# Patient Record
Sex: Female | Born: 1990 | Hispanic: Yes | Marital: Single | Smoking: Never smoker
Health system: Southern US, Community
[De-identification: ages and names within clinical notes are randomized; demographics above are authoritative.]

## PROBLEM LIST (undated history)

## (undated) DIAGNOSIS — I10 Essential (primary) hypertension: Secondary | ICD-10-CM

## (undated) DIAGNOSIS — R809 Proteinuria, unspecified: Secondary | ICD-10-CM

## (undated) DIAGNOSIS — K5900 Constipation, unspecified: Secondary | ICD-10-CM

## (undated) DIAGNOSIS — E119 Type 2 diabetes mellitus without complications: Secondary | ICD-10-CM

---

## 2008-02-05 ENCOUNTER — Emergency Department: Payer: Self-pay | Admitting: Emergency Medicine

## 2008-03-09 ENCOUNTER — Emergency Department: Payer: Self-pay | Admitting: Emergency Medicine

## 2008-06-07 ENCOUNTER — Emergency Department: Payer: Self-pay | Admitting: Emergency Medicine

## 2011-09-30 LAB — BASIC METABOLIC PANEL
Anion Gap: 9 (ref 7–16)
BUN: 10 mg/dL (ref 7–18)
Calcium, Total: 9 mg/dL (ref 8.5–10.1)
Creatinine: 0.72 mg/dL (ref 0.60–1.30)
EGFR (Non-African Amer.): >60
Glucose: 482 mg/dL — ABNORMAL HIGH (ref 65–99)
Osmolality: 290 (ref 275–301)
Potassium: 4.2 mmol/L (ref 3.5–5.1)
Sodium: 135 mmol/L — ABNORMAL LOW (ref 136–145)

## 2011-09-30 LAB — URINALYSIS, COMPLETE
Nitrite: NEGATIVE
Squamous Epithelial: 4
WBC UR: 8 /HPF (ref 0–5)

## 2011-09-30 LAB — CBC
MCH: 30.1 pg (ref 26.0–34.0)
MCV: 90 fL (ref 80–100)
RDW: 12.6 % (ref 11.5–14.5)
WBC: 9.1 10*3/uL (ref 3.6–11.0)

## 2011-09-30 LAB — PREGNANCY, URINE: Pregnancy Test, Urine: NEGATIVE m[IU]/mL

## 2011-10-01 ENCOUNTER — Observation Stay: Payer: Self-pay | Admitting: Internal Medicine

## 2011-10-01 LAB — BASIC METABOLIC PANEL
Anion Gap: 7 (ref 7–16)
BUN: 8 mg/dL (ref 7–18)
Calcium, Total: 7.8 mg/dL — ABNORMAL LOW (ref 8.5–10.1)
Co2: 26 mmol/L (ref 21–32)
Creatinine: 0.54 mg/dL — ABNORMAL LOW (ref 0.60–1.30)
EGFR (African American): >60
Potassium: 3.7 mmol/L (ref 3.5–5.1)
Sodium: 137 mmol/L (ref 136–145)

## 2011-10-01 LAB — HEMOGLOBIN A1C: Hemoglobin A1C: 12.3 % — ABNORMAL HIGH (ref 4.2–6.3)

## 2013-04-25 ENCOUNTER — Emergency Department: Payer: Self-pay | Admitting: Emergency Medicine

## 2014-09-15 NOTE — Discharge Summary (Signed)
PATIENT NAME:  Carolyn Andrews, Carolyn Andrews MR#:  [id number] DATE OF BIRTH:  December 29, 1990  DATE OF ADMISSION:  10/01/2011 DATE OF DISCHARGE:  10/01/2011  DISCHARGE DIAGNOSES:  1. New onset diabetes. 2. Low back pain. 3. Morbid obesity.  DISCHARGE MEDICATIONS: Metformin 1 gram p.o. twice a day.  DISCHARGE DIET: ADA diet.   DISCHARGE FOLLOWUP: Follow up with the [facility] on 10/14/2011. Also the patient was given the telephone number for Dr. Carlena SaxAnna Solum, endocrinologist. The patient was advised to make an appointment.   DISCHARGE INSTRUCTIONS: The patient is advised to check sugars in the morning and also post-prandial. The patient says that her mother also has diabetes and she is very well aware of how to check sugars.  HOSPITAL COURSE: This is a 24 year old very obese female who came in because of low back pain. The patient did not have any polyuria or polydipsia. Look at the history and physical for full details. The patient was found to have a blood sugar of 482 on admission and was admitted to the hospitalist service for hyperglycemia, back pain, and possible new onset diabetes. The patient did have lower back pain with tenderness to palpation on examination when she came in. She was started on IV fluids. Hemoglobin A1c was about 12, so she was started on metformin. At discharge her low back pain resolved and did not have any further problems. Regarding obesity she was counseled to lose at least 100 pounds. She was also seen by a dietician and advised about how to check calories and carbs. She is sent home on metformin and advised to followup with Dr. Tedd SiasSolum.   TIME SPENT ON DISCHARGE PREPARATION: More than 50 minutes.  ____________________________ Katha HammingSnehalatha Kaleigh Spiegelman, MD sk:slb D: 10/02/2011 22:13:54 ET T: 10/05/2011 10:53:37 ET JOB#: [id number]  cc: Katha HammingSnehalatha Albaraa Swingle, MD, <Dictator> Katha HammingSNEHALATHA Dazani Norby MD ELECTRONICALLY SIGNED 10/06/2011 23:31

## 2014-09-15 NOTE — H&P (Signed)
PATIENT NAME:  Carolyn Andrews, Carolyn Andrews MR#:  [id number] DATE OF BIRTH:  April 14, 1991  DATE OF ADMISSION:  10/01/2011  PRIMARY CARE PHYSICIAN: The patient does not have any.  REFERRING PHYSICIAN: Dr. Bayard Males   CHIEF COMPLAINT: Hyperglycemia.   HISTORY OF PRESENT ILLNESS: This is a 24 year old female without significant past medical history who presents to the ED for lower back pain she has had for one day. The patient had basic labs done in the ED where her blood glucose was 482. The patient does not have any history of diabetes, was never diagnosed with diabetes. As well she does not have any complaints of polyuria, polydipsia, or weight loss. The patient reports her mother has a history of diabetes on insulin. As well the patient did not have any anion gap. The ED physician requested admission for further management of her hyperglycemia. As the patient was still requiring IV fluids and correction of her hyperglycemia, it was unsafe for her to be discharged with this blood glucose. Also, the patient does not have a primary care physician and she needs some adjustment of her medication prior to discharge.  Regarding her back pain, the patient reports it has been going on for one day. She denies any straining or lifting heavy objects and reports its is mainly in the lower right back area. Denies any urinary or stool incontinence, or any lower extremity weakness, tingling, or numbness.   PAST MEDICAL HISTORY: None.   PAST SURGICAL HISTORY: None.   HOME MEDICATIONS:  None.  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 Denies any history of smoking, alcohol use, or drug use.   FAMILY HISTORY: Significant for diabetes in her mother who is currently using insulin.   REVIEW OF SYSTEMS: Denies any fever, fatigue, or weakness. EYES: Denies any blurry vision, double vision, pain, or redness. ENT: Denies any tinnitus, ear pain, hearing loss, or epistaxis. RESPIRATORY: Denies  any cough, wheezing, hemoptysis, or dyspnea.  CARDIOVASCULAR: Denies any chest pain, orthopnea, edema, arrhythmia, or palpitation.  GASTROINTESTINAL: Denies any nausea, vomiting, diarrhea, or abdominal pain. GENITOURINARY: Denies any dysuria, hematuria, or renal colic. ENDO: Denies any polyuria, polydipsia, heat or cold intolerance. INTEGUMENT: Denies any acne, rash, or itching. MUSCULOSKELETAL: Complains of lower back pain. No history of arthritis or gout. NEURO: Denies any numbness, weakness, dysarthria, or epilepsy. PSYCH: Denies any anxiety, insomnia, alcohol or drug abuse.   PHYSICAL EXAMINATION: GENERAL: Young, obese female who is comfortable in bed in no apparent distress.   HEENT: Head atraumatic, normocephalic. Pupils equal, reactive to light. Pink conjunctivae. Anicteric sclerae. Moist oral mucosa.   NECK: Supple. No thyromegaly. No JVD.   CHEST: The patient has good air entry bilaterally. No wheezing, rales, or rhonchi.   CARDIOVASCULAR: S1, S2 heard. No rubs, murmurs, or gallops.   ABDOMEN: Obese, soft, nontender, nondistended. Bowel sounds present.   EXTREMITIES: No edema, no clubbing, no cyanosis.   MUSCULOSKELETAL: The patient had right lower back tender to palpation. Has negative leg raise test bilaterally. Equal sensation bilaterally. Motor strength 5/5 both extremities. Deep tendon reflexes symmetrical.   PERTINENT LABS: Glucose 482, BUN 10, creatinine 0.72, sodium 135, potassium 4.2, chloride 101, CO2 25. Anion gap 9, white blood cells 9.1, hemoglobin 15, hematocrit 45.1, platelets 242.  Pregnancy test is negative. Glucosuria in urine analysis.    ASSESSMENT AND PLAN:  1. New onset diabetes: Secondary to patient's lack of primary care physician. Will be admitted for one day for observation. Will be  started on metformin 500 mg oral twice a day before meals. As well will be started on insulin sliding scale before meals. Will be kept on fluids IV normal saline 100 mL per hour.  The patient will be given insulin and fluid bolus in the ED. We will have nutritional service and diabetic inpatient consult for the patient's education. We will check a hemoglobin A1c. 2. Obesity: The patient was counseled about the importance of weight loss, especially given the fact of her new onset diabetes.  3. Lower back pain: Appears to be due to a muscle spasm versus sciatica.  We will have the patient on Percocet and Flexeril as needed for pain.  4. Deep vein thrombosis prophylaxis: The patient is ambulatory and we will have her on sequential compression device.   TOTAL TIME SPENT ON PATIENT CARE: 40 minutes.     ____________________________ Starleen Armsawood S. Elgergawy, MD dse:bjt D: 10/01/2011 03:55:49 ET T: 10/01/2011 07:47:34 ET JOB#: [id number]  cc: Starleen Armsawood S. Elgergawy, MD, <Dictator> DAWOOD Teena IraniS ELGERGAWY MD ELECTRONICALLY SIGNED 10/03/2011 8:25

## 2014-10-29 ENCOUNTER — Encounter: Payer: Self-pay | Admitting: Emergency Medicine

## 2014-10-29 DIAGNOSIS — R109 Unspecified abdominal pain: Secondary | ICD-10-CM | POA: Insufficient documentation

## 2014-10-29 DIAGNOSIS — E1165 Type 2 diabetes mellitus with hyperglycemia: Secondary | ICD-10-CM | POA: Insufficient documentation

## 2014-10-29 DIAGNOSIS — Z3202 Encounter for pregnancy test, result negative: Secondary | ICD-10-CM | POA: Insufficient documentation

## 2014-10-29 DIAGNOSIS — R110 Nausea: Secondary | ICD-10-CM | POA: Insufficient documentation

## 2014-10-29 LAB — URINALYSIS COMPLETE WITH MICROSCOPIC (ARMC ONLY)
Bacteria, UA: NONE SEEN
Bilirubin Urine: NEGATIVE
Hgb urine dipstick: NEGATIVE
KETONES UR: NEGATIVE mg/dL
Leukocytes, UA: NEGATIVE
NITRITE: NEGATIVE
PH: 7 (ref 5.0–8.0)
Protein, ur: NEGATIVE mg/dL
SPECIFIC GRAVITY, URINE: 1.031 — AB (ref 1.005–1.030)

## 2014-10-29 LAB — POCT PREGNANCY, URINE: PREG TEST UR: NEGATIVE

## 2014-10-29 LAB — GLUCOSE, CAPILLARY: Glucose-Capillary: 451 mg/dL — ABNORMAL HIGH (ref 65–99)

## 2014-10-29 LAB — CBC
HEMATOCRIT: 45.7 % (ref 35.0–47.0)
HEMOGLOBIN: 15.3 g/dL (ref 12.0–16.0)
MCH: 29.3 pg (ref 26.0–34.0)
MCHC: 33.5 g/dL (ref 32.0–36.0)
MCV: 87.5 fL (ref 80.0–100.0)
Platelets: 219 10*3/uL (ref 150–440)
RBC: 5.22 MIL/uL — ABNORMAL HIGH (ref 3.80–5.20)
RDW: 12.6 % (ref 11.5–14.5)
WBC: 8.5 10*3/uL (ref 3.6–11.0)

## 2014-10-29 NOTE — ED Notes (Addendum)
Patient ambulatory to triage with steady gait, without difficulty or distress noted; pt reports FSBS 400 at home; c/o nausea, dizziness, and tired; pt st does not take any meds for diabetes

## 2014-10-30 ENCOUNTER — Emergency Department
Admission: EM | Admit: 2014-10-30 | Discharge: 2014-10-30 | Disposition: A | Discharge status: Home / Self Care | Payer: Self-pay | Attending: Emergency Medicine | Admitting: Emergency Medicine

## 2014-10-30 DIAGNOSIS — R739 Hyperglycemia, unspecified: Secondary | ICD-10-CM

## 2014-10-30 HISTORY — DX: Type 2 diabetes mellitus without complications: E11.9

## 2014-10-30 LAB — COMPREHENSIVE METABOLIC PANEL
ALK PHOS: 132 U/L — AB (ref 38–126)
ALT: 256 U/L — ABNORMAL HIGH (ref 14–54)
ANION GAP: 11 (ref 5–15)
AST: 120 U/L — AB (ref 15–41)
Albumin: 3.8 g/dL (ref 3.5–5.0)
BUN: 12 mg/dL (ref 6–20)
CO2: 25 mmol/L (ref 22–32)
Calcium: 9.2 mg/dL (ref 8.9–10.3)
Chloride: 99 mmol/L — ABNORMAL LOW (ref 101–111)
Creatinine, Ser: 0.7 mg/dL (ref 0.44–1.00)
Glucose, Bld: 478 mg/dL — ABNORMAL HIGH (ref 65–99)
Potassium: 3.8 mmol/L (ref 3.5–5.1)
Sodium: 135 mmol/L (ref 135–145)
TOTAL PROTEIN: 8 g/dL (ref 6.5–8.1)
Total Bilirubin: 0.5 mg/dL (ref 0.3–1.2)

## 2014-10-30 LAB — GLUCOSE, CAPILLARY
GLUCOSE-CAPILLARY: 326 mg/dL — AB (ref 65–99)
GLUCOSE-CAPILLARY: 289 mg/dL — AB (ref 65–99)

## 2014-10-30 MED ORDER — INSULIN ASPART 100 UNIT/ML ~~LOC~~ SOLN
10.0000 [IU] | Freq: Once | SUBCUTANEOUS | Status: AC
  Administered 2014-10-30: 10 [IU] via SUBCUTANEOUS

## 2014-10-30 MED ORDER — SODIUM CHLORIDE 0.9 % IV SOLN
Freq: Once | INTRAVENOUS | Status: AC
  Administered 2014-10-30: 03:00:00 via INTRAVENOUS

## 2014-10-30 MED ORDER — INSULIN ASPART 100 UNIT/ML ~~LOC~~ SOLN
SUBCUTANEOUS | Status: AC
  Filled 2014-10-30: qty 1

## 2014-10-30 MED ORDER — METFORMIN HCL 1000 MG PO TABS: 1000.0000 mg | ORAL_TABLET | Freq: Two times a day (BID) | ORAL | Status: DC

## 2014-10-30 NOTE — ED Notes (Signed)
Report received from Shana RN. Patient care assumed. Patient/RN introduction complete. Will continue to monitor.  

## 2014-10-30 NOTE — Discharge Instructions (Signed)
High Blood Sugar °High blood sugar (hyperglycemia) means that the level of sugar in your blood is higher than it should be. Signs of high blood sugar include: °· Feeling thirsty. °· Frequent peeing (urinating). °· Feeling tired or sleepy. °· Dry mouth. °· Vision changes. °· Feeling weak. °· Feeling hungry but losing weight. °· Numbness and tingling in your hands or feet. °· Headache. °When you ignore these signs, your blood sugar may keep going up. These problems may get worse, and other problems may begin. °HOME CARE °· Check your blood sugars as told by your doctor. Write down the numbers with the date and time. °· Take the right amount of insulin or diabetes pills at the right time. Write down the dose with date and time. °· Refill your insulin or diabetes pills before running out. °· Watch what you eat. Follow your meal plan. °· Drink liquids without sugar, such as water. Check with your doctor if you have kidney or heart disease. °· Follow your doctor's orders for exercise. Exercise at the same time of day. °· Keep your doctor's appointments. °GET HELP RIGHT AWAY IF:  °· You have trouble thinking or are confused. °· You have fast breathing with fruity smelling breath. °· You pass out (faint). °· You have 2 to 3 days of high blood sugars and you do not know why. °· You have chest pain. °· You are feeling sick to your stomach (nauseous) or throwing up (vomiting). °· You have sudden vision changes. °MAKE SURE YOU:  °· Understand these instructions. °· Will watch your condition. °· Will get help right away if you are not doing well or get worse. °Document Released: 03/07/2009 Document Revised: 08/02/2011 Document Reviewed: 03/07/2009 °[employer]® Patient Information ©2015 [employer]. This information is not intended to replace advice given to you by your health care provider. Make sure you discuss any questions you have with your health care provider. ° °

## 2014-10-30 NOTE — ED Notes (Signed)
Patient discharged to home per MD order. Patient in stable condition, and deemed medically cleared by ED provider for discharge. Discharge instructions reviewed with patient/family using "Teach Back"; verbalized understanding of medication education and administration, and information about follow-up care. Denies further concerns. ° °

## 2014-10-30 NOTE — ED Provider Notes (Signed)
[facility] Emergency Department Provider Note  ____________________________________________  Time seen: Approximately 106 AM  I have reviewed the triage vital signs and the nursing notes.   HISTORY  Chief Complaint Hyperglycemia    HPI Kimbree Casanas is a 24 y.o. female comes in with elevated blood sugar. The patient reports that he never her blood sugar is high she gets very thirsty and has headaches and sometimes her right arm will get numb. The patient reports that she's had these symptoms for 3 days. The patient was diagnosed with diabetes a few months ago but reports that she does not take any medication for her diabetes. She reports that she has not been able to follow-up with her primary care physician and no one ever gave her medication for her diabetes. The patient reports that when she tries to follow-up with [facility] they are unable to give her an appointment for months. The patient reports that she has been drinking water as well. She reports that she did have some abdominal pain but it improves whenever she drinks water.The patient is no pain currently.   Past Medical History  Diagnosis Date  . Diabetes     There are no active problems to display for this patient.   History reviewed. No pertinent past surgical history.  No current outpatient prescriptions on file.  Allergies Review of patient's allergies indicates no known allergies.  No family history on file.  Social History History  Substance Use Topics  . Smoking status: Never Smoker   . Smokeless tobacco: Not on file  . Alcohol Use: No    Review of Systems Constitutional: No fever/chills Eyes: No visual changes. ENT: No sore throat. Cardiovascular: Denies chest pain. Respiratory: Denies shortness of breath. Gastrointestinal: Nausea, abdominal pain Genitourinary: Negative for dysuria. Musculoskeletal: Negative for back pain. Skin: Negative for  rash. Neurological: headache  10-point ROS otherwise negative.  ____________________________________________   PHYSICAL EXAM:  VITAL SIGNS: ED Triage Vitals  Enc Vitals Group     BP 10/29/14 2250 173/96 mmHg     Pulse Rate 10/29/14 2250 79     Resp 10/29/14 2250 18     Temp 10/29/14 2250 97.6 F (36.4 C)     Temp Source 10/29/14 2250 Oral     SpO2 10/29/14 2250 98 %     Weight 10/29/14 2250 190 lb (86.183 kg)     Height 10/29/14 2250 5' (1.524 m)     Head Cir --      Peak Flow --      Pain Score 10/30/14 0258 3     Pain Loc --      Pain Edu? --      Excl. in GC? --     Constitutional: Alert and oriented. Well appearing and in no acute distress. Eyes: Conjunctivae are normal. PERRL. EOMI. Head: Atraumatic. Nose: No congestion/rhinnorhea. Mouth/Throat: Mucous membranes are moist.  Oropharynx non-erythematous. Cardiovascular: Normal rate, regular rhythm. Grossly normal heart sounds.  Good peripheral circulation. Respiratory: Normal respiratory effort.  No retractions. Lungs CTAB. Gastrointestinal: Soft and nontender. No distention. Positive bowel sounds Genitourinary: Deferred Musculoskeletal: No lower extremity tenderness nor edema.   Neurologic:  Normal speech and language. No gross focal neurologic deficits are appreciated. Skin:  Skin is warm, dry and intact. No rash noted. Psychiatric: Mood and affect are normal.   ____________________________________________   LABS (all labs ordered are listed, but only abnormal results are displayed)  Labs Reviewed  CBC - Abnormal; Notable for the  following:    RBC 5.22 (*)    All other components within normal limits  COMPREHENSIVE METABOLIC PANEL - Abnormal; Notable for the following:    Chloride 99 (*)    Glucose, Bld 478 (*)    AST 120 (*)    ALT 256 (*)    Alkaline Phosphatase 132 (*)    All other components within normal limits  URINALYSIS COMPLETEWITH MICROSCOPIC ([facility] ONLY) - Abnormal; Notable for the  following:    Color, Urine STRAW (*)    APPearance CLEAR (*)    Glucose, UA >500 (*)    Specific Gravity, Urine 1.031 (*)    Squamous Epithelial / LPF 0-5 (*)    All other components within normal limits  GLUCOSE, CAPILLARY - Abnormal; Notable for the following:    Glucose-Capillary 451 (*)    All other components within normal limits  GLUCOSE, CAPILLARY - Abnormal; Notable for the following:    Glucose-Capillary 326 (*)    All other components within normal limits  CBG MONITORING, ED  POC URINE PREG, ED  POCT PREGNANCY, URINE   ____________________________________________  EKG  None ____________________________________________  RADIOLOGY  None ____________________________________________   PROCEDURES  Procedure(s) performed: None  Critical Care performed: No  ____________________________________________   INITIAL IMPRESSION / ASSESSMENT AND PLAN / ED COURSE  Pertinent labs & imaging results that were available during my care of the patient were reviewed by me and considered in my medical decision making (see chart for details).  The patient is a 24 year old female with a history of diabetes who has not been taking any medication and comes in with hyperglycemia. We did give the patient 2 L of normal saline and it did bring her blood sugar down to 326. We also did patient 10 units of insulin subcutaneously and her blood sugar has been brought down to 289. I will give the patient a prescription for metformin and encourage her that she does need to follow-up and establish care with a primary care physician. The patient otherwise has no other complaints and feels well while the emergency department. She'll be discharged home. He does not have any significant anion gap, hyponatremia other electrolyte abnormalities with a concern for severe hyper glycemia. ____________________________________________   FINAL CLINICAL IMPRESSION(S) / ED DIAGNOSES  Final diagnoses:   Hyperglycemia       Rebecka ApleyAllison P Dhrithi Riche, MD 10/30/14 [id number]

## 2014-12-28 ENCOUNTER — Emergency Department
Admission: EM | Admit: 2014-12-28 | Discharge: 2014-12-28 | Disposition: A | Discharge status: Home / Self Care | Payer: Self-pay | Attending: Emergency Medicine | Admitting: Emergency Medicine

## 2014-12-28 ENCOUNTER — Encounter: Payer: Self-pay | Admitting: Emergency Medicine

## 2014-12-28 DIAGNOSIS — R739 Hyperglycemia, unspecified: Secondary | ICD-10-CM

## 2014-12-28 DIAGNOSIS — E860 Dehydration: Secondary | ICD-10-CM

## 2014-12-28 DIAGNOSIS — Z3202 Encounter for pregnancy test, result negative: Secondary | ICD-10-CM | POA: Insufficient documentation

## 2014-12-28 DIAGNOSIS — E1165 Type 2 diabetes mellitus with hyperglycemia: Secondary | ICD-10-CM | POA: Insufficient documentation

## 2014-12-28 DIAGNOSIS — R0600 Dyspnea, unspecified: Secondary | ICD-10-CM

## 2014-12-28 DIAGNOSIS — Z79899 Other long term (current) drug therapy: Secondary | ICD-10-CM | POA: Insufficient documentation

## 2014-12-28 LAB — CBC
HEMATOCRIT: 44.3 % (ref 35.0–47.0)
Hemoglobin: 15 g/dL (ref 12.0–16.0)
MCH: 29.6 pg (ref 26.0–34.0)
MCHC: 33.9 g/dL (ref 32.0–36.0)
MCV: 87.3 fL (ref 80.0–100.0)
PLATELETS: 238 10*3/uL (ref 150–440)
RBC: 5.08 MIL/uL (ref 3.80–5.20)
RDW: 12.5 % (ref 11.5–14.5)
WBC: 8.5 10*3/uL (ref 3.6–11.0)

## 2014-12-28 LAB — URINALYSIS COMPLETE WITH MICROSCOPIC (ARMC ONLY)
BACTERIA UA: NONE SEEN
Bilirubin Urine: NEGATIVE
Hgb urine dipstick: NEGATIVE
LEUKOCYTES UA: NEGATIVE
Nitrite: NEGATIVE
Protein, ur: NEGATIVE mg/dL
Specific Gravity, Urine: 1.027 (ref 1.005–1.030)
pH: 9 — ABNORMAL HIGH (ref 5.0–8.0)

## 2014-12-28 LAB — BASIC METABOLIC PANEL
Anion gap: 9 (ref 5–15)
BUN: 11 mg/dL (ref 6–20)
CALCIUM: 9.5 mg/dL (ref 8.9–10.3)
CHLORIDE: 101 mmol/L (ref 101–111)
CO2: 25 mmol/L (ref 22–32)
Creatinine, Ser: 0.58 mg/dL (ref 0.44–1.00)
Glucose, Bld: 456 mg/dL — ABNORMAL HIGH (ref 65–99)
POTASSIUM: 3.9 mmol/L (ref 3.5–5.1)
SODIUM: 135 mmol/L (ref 135–145)

## 2014-12-28 LAB — GLUCOSE, CAPILLARY
Glucose-Capillary: 459 mg/dL — ABNORMAL HIGH (ref 65–99)
Glucose-Capillary: 344 mg/dL — ABNORMAL HIGH (ref 65–99)

## 2014-12-28 LAB — POCT PREGNANCY, URINE: Preg Test, Ur: NEGATIVE

## 2014-12-28 MED ORDER — SODIUM CHLORIDE 0.9 % IV BOLUS (SEPSIS)
1000.0000 mL | Freq: Once | INTRAVENOUS | Status: AC
  Administered 2014-12-28: 1000 mL via INTRAVENOUS

## 2014-12-28 MED ORDER — ONDANSETRON 8 MG PO TBDP: 8.0000 mg | ORAL_TABLET | Freq: Three times a day (TID) | ORAL | Status: DC | PRN

## 2014-12-28 NOTE — Discharge Instructions (Signed)
Blood Glucose Monitoring °Monitoring your blood glucose (also know as blood sugar) helps you to manage your diabetes. It also helps you and your health care provider monitor your diabetes and determine how well your treatment plan is working. °WHY SHOULD YOU MONITOR YOUR BLOOD GLUCOSE? °· It can help you understand how food, exercise, and medicine affect your blood glucose. °· It allows you to know what your blood glucose is at any given moment. You can quickly tell if you are having low blood glucose (hypoglycemia) or high blood glucose (hyperglycemia). °· It can help you and your health care provider know how to adjust your medicines. °· It can help you understand how to manage an illness or adjust medicine for exercise. °WHEN SHOULD YOU TEST? °Your health care provider will help you decide how often you should check your blood glucose. This may depend on the type of diabetes you have, your diabetes control, or the types of medicines you are taking. Be sure to write down all of your blood glucose readings so that this information can be reviewed with your health care provider. See below for examples of testing times that your health care provider may suggest. °Type 1 Diabetes °· Test 4 times a day if you are in good control, using an insulin pump, or perform multiple daily injections. °· If your diabetes is not well controlled or if you are sick, you may need to monitor more often. °· It is a good idea to also monitor: °· Before and after exercise. °· Between meals and 2 hours after a meal. °· Occasionally between 2:00 a.m. and 3:00 a.m. °Type 2 Diabetes °· It can vary with each person, but generally, if you are on insulin, test 4 times a day. °· If you take medicines by mouth (orally), test 2 times a day. °· If you are on a controlled diet, test once a day. °· If your diabetes is not well controlled or if you are sick, you may need to monitor more often. °HOW TO MONITOR YOUR BLOOD GLUCOSE °Supplies  Needed °· Blood glucose meter. °· Test strips for your meter. Each meter has its own strips. You must use the strips that go with your own meter. °· A pricking needle (lancet). °· A device that holds the lancet (lancing device). °· A journal or log book to write down your results. °Procedure °· Wash your hands with soap and water. Alcohol is not preferred. °· Prick the side of your finger (not the tip) with the lancet. °· Gently milk the finger until a small drop of blood appears. °· Follow the instructions that come with your meter for inserting the test strip, applying blood to the strip, and using your blood glucose meter. °Other Areas to Get Blood for Testing °Some meters allow you to use other areas of your body (other than your finger) to test your blood. These areas are called alternative sites. The most common alternative sites are: °· The forearm. °· The thigh. °· The back area of the lower leg. °· The palm of the hand. °The blood flow in these areas is slower. Therefore, the blood glucose values you get may be delayed, and the numbers are different from what you would get from your fingers. Do not use alternative sites if you think you are having hypoglycemia. Your reading will not be accurate. Always use a finger if you are having hypoglycemia. Also, if you cannot feel your lows (hypoglycemia unawareness), always use your fingers for your   blood glucose checks. ADDITIONAL TIPS FOR GLUCOSE MONITORING  Do not reuse lancets.  Always carry your supplies with you.  All blood glucose meters have a 24-hour "hotline" number to call if you have questions or need help.  Adjust (calibrate) your blood glucose meter with a control solution after finishing a few boxes of strips. BLOOD GLUCOSE RECORD KEEPING It is a good idea to keep a daily record or log of your blood glucose readings. Most glucose meters, if not all, keep your glucose records stored in the meter. Some meters come with the ability to download  your records to your home computer. Keeping a record of your blood glucose readings is especially helpful if you are wanting to look for patterns. Make notes to go along with the blood glucose readings because you might forget what happened at that exact time. Keeping good records helps you and your health care provider to work together to achieve good diabetes management.  Document Released: 05/13/2003 Document Revised: 09/24/2013 Document Reviewed: 10/02/2012 [facility] Patient Information 2015 [place], [place]. This information is not intended to replace advice given to you by your health care provider. Make sure you discuss any questions you have with your health care provider.  Dehydration, Adult Dehydration is when you lose more fluids from the body than you take in. Vital organs like the kidneys, brain, and heart cannot function without a proper amount of fluids and salt. Any loss of fluids from the body can cause dehydration.  CAUSES   Vomiting.  Diarrhea.  Excessive sweating.  Excessive urine output.  Fever. SYMPTOMS  Mild dehydration  Thirst.  Dry lips.  Slightly dry mouth. Moderate dehydration  Very dry mouth.  Sunken eyes.  Skin does not bounce back quickly when lightly pinched and released.  Dark urine and decreased urine production.  Decreased tear production.  Headache. Severe dehydration  Very dry mouth.  Extreme thirst.  Rapid, weak pulse (more than 100 beats per minute at rest).  Cold hands and feet.  Not able to sweat in spite of heat and temperature.  Rapid breathing.  Blue lips.  Confusion and lethargy.  Difficulty being awakened.  Minimal urine production.  No tears. DIAGNOSIS  Your caregiver will diagnose dehydration based on your symptoms and your exam. Blood and urine tests will help confirm the diagnosis. The diagnostic evaluation should also identify the cause of dehydration. TREATMENT  Treatment of mild or moderate dehydration  can often be done at home by increasing the amount of fluids that you drink. It is best to drink small amounts of fluid more often. Drinking too much at one time can make vomiting worse. Refer to the home care instructions below. Severe dehydration needs to be treated at the hospital where you will probably be given intravenous (IV) fluids that contain water and electrolytes. HOME CARE INSTRUCTIONS   Ask your caregiver about specific rehydration instructions.  Drink enough fluids to keep your urine clear or pale yellow.  Drink small amounts frequently if you have nausea and vomiting.  Eat as you normally do.  Avoid:  Foods or drinks high in sugar.  Carbonated drinks.  Juice.  Extremely hot or cold fluids.  Drinks with caffeine.  Fatty, greasy foods.  Alcohol.  Tobacco.  Overeating.  Gelatin desserts.  Wash your hands well to avoid spreading bacteria and viruses.  Only take over-the-counter or prescription medicines for pain, discomfort, or fever as directed by your caregiver.  Ask your caregiver if you should continue all prescribed and  over-the-counter medicines.  Keep all follow-up appointments with your caregiver. SEEK MEDICAL CARE IF:  You have abdominal pain and it increases or stays in one area (localizes).  You have a rash, stiff neck, or severe headache.  You are irritable, sleepy, or difficult to awaken.  You are weak, dizzy, or extremely thirsty. SEEK IMMEDIATE MEDICAL CARE IF:   You are unable to keep fluids down or you get worse despite treatment.  You have frequent episodes of vomiting or diarrhea.  You have blood or green matter (bile) in your vomit.  You have blood in your stool or your stool looks black and tarry.  You have not urinated in 6 to 8 hours, or you have only urinated a small amount of very dark urine.  You have a fever.  You faint. MAKE SURE YOU:   Understand these instructions.  Will watch your condition.  Will get  help right away if you are not doing well or get worse. Document Released: 05/10/2005 Document Revised: 08/02/2011 Document Reviewed: 12/28/2010 [facility] Patient Information 2015 [place], [place]. This information is not intended to replace advice given to you by your health care provider. Make sure you discuss any questions you have with your health care provider.  Hyperglycemia Hyperglycemia occurs when the glucose (sugar) in your blood is too high. Hyperglycemia can happen for many reasons, but it most often happens to people who do not know they have diabetes or are not managing their diabetes properly.  CAUSES  Whether you have diabetes or not, there are other causes of hyperglycemia. Hyperglycemia can occur when you have diabetes, but it can also occur in other situations that you might not be as aware of, such as: Diabetes  If you have diabetes and are having problems controlling your blood glucose, hyperglycemia could occur because of some of the following reasons:  Not following your meal plan.  Not taking your diabetes medications or not taking it properly.  Exercising less or doing less activity than you normally do.  Being sick. Pre-diabetes  This cannot be ignored. Before people develop Type 2 diabetes, they almost always have "pre-diabetes." This is when your blood glucose levels are higher than normal, but not yet high enough to be diagnosed as diabetes. Research has shown that some long-term damage to the body, especially the heart and circulatory system, may already be occurring during pre-diabetes. If you take action to manage your blood glucose when you have pre-diabetes, you may delay or prevent Type 2 diabetes from developing. Stress  If you have diabetes, you may be "diet" controlled or on oral medications or insulin to control your diabetes. However, you may find that your blood glucose is higher than usual in the hospital whether you have diabetes or not. This is often  referred to as "stress hyperglycemia." Stress can elevate your blood glucose. This happens because of hormones put out by the body during times of stress. If stress has been the cause of your high blood glucose, it can be followed regularly by your caregiver. That way he/she can make sure your hyperglycemia does not continue to get worse or progress to diabetes. Steroids  Steroids are medications that act on the infection fighting system (immune system) to block inflammation or infection. One side effect can be a rise in blood glucose. Most people can produce enough extra insulin to allow for this rise, but for those who cannot, steroids make blood glucose levels go even higher. It is not unusual for steroid treatments  to "uncover" diabetes that is developing. It is not always possible to determine if the hyperglycemia will go away after the steroids are stopped. A special blood test called an A1c is sometimes done to determine if your blood glucose was elevated before the steroids were started. SYMPTOMS  Thirsty.  Frequent urination.  Dry mouth.  Blurred vision.  Tired or fatigue.  Weakness.  Sleepy.  Tingling in feet or leg. DIAGNOSIS  Diagnosis is made by monitoring blood glucose in one or all of the following ways:  A1c test. This is a chemical found in your blood.  Fingerstick blood glucose monitoring.  Laboratory results. TREATMENT  First, knowing the cause of the hyperglycemia is important before the hyperglycemia can be treated. Treatment may include, but is not be limited to:  Education.  Change or adjustment in medications.  Change or adjustment in meal plan.  Treatment for an illness, infection, etc.  More frequent blood glucose monitoring.  Change in exercise plan.  Decreasing or stopping steroids.  Lifestyle changes. HOME CARE INSTRUCTIONS   Test your blood glucose as directed.  Exercise regularly. Your caregiver will give you instructions about  exercise. Pre-diabetes or diabetes which comes on with stress is helped by exercising.  Eat wholesome, balanced meals. Eat often and at regular, fixed times. Your caregiver or nutritionist will give you a meal plan to guide your sugar intake.  Being at an ideal weight is important. If needed, losing as little as 10 to 15 pounds may help improve blood glucose levels. SEEK MEDICAL CARE IF:   You have questions about medicine, activity, or diet.  You continue to have symptoms (problems such as increased thirst, urination, or weight gain). SEEK IMMEDIATE MEDICAL CARE IF:   You are vomiting or have diarrhea.  Your breath smells fruity.  You are breathing faster or slower.  You are very sleepy or incoherent.  You have numbness, tingling, or pain in your feet or hands.  You have chest pain.  Your symptoms get worse even though you have been following your caregiver's orders.  If you have any other questions or concerns. Document Released: 11/03/2000 Document Revised: 08/02/2011 Document Reviewed: 09/06/2011 [facility] Patient Information 2015 [place], [place]. This information is not intended to replace advice given to you by your health care provider. Make sure you discuss any questions you have with your health care provider.

## 2014-12-28 NOTE — ED Notes (Signed)
Pt reports SOB started today.  Pt denies CP and nausea.  Pt reports some dizziness.  Pt hyperglycemic in triage.  Reports taking glipizide, started about a month ago.  Pt reports right side pain, reports it feels tired from breathing hard.  Respirations equal and unlabored at this time.  Pt NAD at this time.

## 2014-12-28 NOTE — ED Notes (Signed)
Pt. Has bs of 459 in triage.

## 2014-12-28 NOTE — ED Provider Notes (Signed)
[facility] Emergency Department Provider Note  ____________________________________________  Time seen: 10:00 PM  I have reviewed the triage vital signs and the nursing notes.   HISTORY  Chief Complaint Shortness of Breath    HPI Carolyn Andrews is a 24 y.o. female who complains of shortness of breath that started 5 minutes after taking her glipizide this evening. She notes that she was recently diagnosed with diabetes about one month ago. She was unable to tolerate metformin due to GI upset so she only takes glipizide. She follows up with [facility] endocrinology. She does note that just prior to the onset of symptoms she was feeling worried about her diabetes.  She does not routinely check her blood sugars and can't remember the last time she checked it. She does report having polyuria, but no dysuria or hematuria. No fever chills chest pain nausea vomiting diarrhea abdominal pain or cough. No syncope.     Past Medical History  Diagnosis Date  . Diabetes     There are no active problems to display for this patient.   History reviewed. No pertinent past surgical history.  Current Outpatient Rx  Name  Route  Sig  Dispense  Refill  . FLUoxetine (PROZAC) 20 MG capsule   Oral   Take 20 mg by mouth daily.         Marland Kitchen glipiZIDE (GLUCOTROL) 10 MG tablet   Oral   Take 10 mg by mouth daily before breakfast.         . metFORMIN (GLUCOPHAGE) 1000 MG tablet   Oral   Take 1 tablet (1,000 mg total) by mouth 2 (two) times daily with a meal.   60 tablet   0   . ondansetron (ZOFRAN ODT) 8 MG disintegrating tablet   Oral   Take 1 tablet (8 mg total) by mouth every 8 (eight) hours as needed for nausea or vomiting.   20 tablet   0     Allergies Review of patient's allergies indicates no known allergies.  Family History  Problem Relation Age of Onset  . Diabetes Mother     Social History History  Substance Use Topics  . Smoking status: Never  Smoker   . Smokeless tobacco: Not on file  . Alcohol Use: No    Review of Systems  Constitutional: No fever or chills. No weight changes Eyes:No blurry vision or double vision.  ENT: No sore throat. Cardiovascular: No chest pain. Respiratory: Dyspnea as above. Gastrointestinal: Negative for abdominal pain, vomiting and diarrhea.  No BRBPR or melena. Genitourinary: Negative for dysuria, urinary retention, bloody urine, or difficulty urinating. Musculoskeletal: Negative for back pain. No joint swelling or pain. Skin: Negative for rash. Neurological: Negative for headaches, focal weakness or numbness. Psychiatric:Depression.   Endocrine:No hot/cold intolerance, changes in energy, or sleep difficulty.  10-point ROS otherwise negative.  ____________________________________________   PHYSICAL EXAM:  VITAL SIGNS: ED Triage Vitals  Enc Vitals Group     BP 12/28/14 2127 139/70 mmHg     Pulse Rate 12/28/14 2127 69     Resp 12/28/14 2204 18     Temp --      Temp src --      SpO2 12/28/14 2127 99 %     Weight 12/28/14 2127 197 lb (89.359 kg)     Height 12/28/14 2127  (1.549 m)     Head Cir --      Peak Flow --      Pain Score 12/28/14 2133  5     Pain Loc --      Pain Edu? --      Excl. in GC? --      Constitutional: Alert and oriented. Well appearing and in no distress. Eyes: No scleral icterus. No conjunctival pallor. PERRL. EOMI ENT   Head: Normocephalic and atraumatic.   Nose: No congestion/rhinnorhea. No septal hematoma   Mouth/Throat: MMM, no pharyngeal erythema. No peritonsillar mass. No uvula shift.   Neck: No stridor. No SubQ emphysema. No meningismus. Hematological/Lymphatic/Immunilogical: No cervical lymphadenopathy. Cardiovascular: RRR. Normal and symmetric distal pulses are present in all extremities. No murmurs, rubs, or gallops. Respiratory: Normal respiratory effort without tachypnea nor retractions. Breath sounds are clear and equal  bilaterally. No wheezes/rales/rhonchi. Gastrointestinal: Soft and nontender. No distention. There is no CVA tenderness.  No rebound, rigidity, or guarding. Genitourinary: deferred Musculoskeletal: Nontender with normal range of motion in all extremities. No joint effusions.  No lower extremity tenderness.  No edema. Neurologic:   Normal speech and language.  CN 2-10 normal. Motor grossly intact. No pronator drift.  Normal gait. No gross focal neurologic deficits are appreciated.  Skin:  Skin is warm, dry and intact. No rash noted.  No petechiae, purpura, or bullae. Psychiatric: Mood and affect are normal. Speech and behavior are normal. Patient exhibits appropriate insight and judgment.  ____________________________________________    LABS (pertinent positives/negatives) (all labs ordered are listed, but only abnormal results are displayed) Labs Reviewed  BASIC METABOLIC PANEL - Abnormal; Notable for the following:    Glucose, Bld 456 (*)    All other components within normal limits  URINALYSIS COMPLETEWITH MICROSCOPIC ([facility] ONLY) - Abnormal; Notable for the following:    Color, Urine COLORLESS (*)    APPearance CLEAR (*)    Glucose, UA >500 (*)    Ketones, ur TRACE (*)    pH 9.0 (*)    Squamous Epithelial / LPF 0-5 (*)    All other components within normal limits  GLUCOSE, CAPILLARY - Abnormal; Notable for the following:    Glucose-Capillary 459 (*)    All other components within normal limits  GLUCOSE, CAPILLARY - Abnormal; Notable for the following:    Glucose-Capillary 344 (*)    All other components within normal limits  CBC  CBG MONITORING, ED  CBG MONITORING, ED  POCT PREGNANCY, URINE  CBG MONITORING, ED   ____________________________________________   EKG    ____________________________________________    RADIOLOGY    ____________________________________________   PROCEDURES  ____________________________________________   INITIAL IMPRESSION /  ASSESSMENT AND PLAN / ED COURSE  Pertinent labs & imaging results that were available during my care of the patient were reviewed by me and considered in my medical decision making (see chart for details).  Labs unremarkable no evidence of acidosis. She is tolerating oral intake and not in distress. Advised the patient this is likely due to dehydration from polyuria and hyperglycemia as well as an element of stress or anxiety. Low suspicion that this fast after taking the glipizide has anything to do with the medication. Advised her to continue taking the medication and to follow-up at [facility] endocrinology closely for continued titration and adjustment of her hyperglycemia regimen.  ____________________________________________   FINAL CLINICAL IMPRESSION(S) / ED DIAGNOSES  Final diagnoses:  Dyspnea  Dehydration  Hyperglycemia      Sharman Cheek, MD 12/28/14 2321

## 2014-12-28 NOTE — ED Notes (Signed)
Pt. States recent dx of diabetes 2 last month, pt. States taking glipizide.  Pt. States she took glipizide tonight, pt. States soon afterwards became short of breath.  Pt. Denies vomiting or diarrhea.  Pt. States feeling dizzy tonight.

## 2015-02-23 DIAGNOSIS — E119 Type 2 diabetes mellitus without complications: Secondary | ICD-10-CM | POA: Insufficient documentation

## 2015-02-23 DIAGNOSIS — Z79899 Other long term (current) drug therapy: Secondary | ICD-10-CM | POA: Insufficient documentation

## 2015-02-23 DIAGNOSIS — N760 Acute vaginitis: Secondary | ICD-10-CM | POA: Insufficient documentation

## 2015-02-23 NOTE — ED Notes (Signed)
Patient ambulatory to triage with steady gait, without difficulty or distress noted; pt reports vag itching since taking pill to regulate menstrual period; used vagisil and monistat without relief

## 2015-02-24 ENCOUNTER — Encounter: Payer: Self-pay | Admitting: Emergency Medicine

## 2015-02-24 ENCOUNTER — Emergency Department
Admission: EM | Admit: 2015-02-24 | Discharge: 2015-02-24 | Disposition: A | Discharge status: Home / Self Care | Payer: Self-pay | Attending: Emergency Medicine | Admitting: Emergency Medicine

## 2015-02-24 DIAGNOSIS — N760 Acute vaginitis: Secondary | ICD-10-CM

## 2015-02-24 NOTE — Discharge Instructions (Signed)

## 2015-02-24 NOTE — ED Provider Notes (Signed)
[facility] Emergency Department Provider Note  ____________________________________________  Time seen: Approximately 3:42 AM  I have reviewed the triage vital signs and the nursing notes.   HISTORY  Chief Complaint Vaginitis    HPI Carolyn Andrews is a 24 y.o. female with a history of diabetes who comes in with vaginal itching. The patient reports that she was given a pill to regulate her menstrual cycle that she was postictal for 10 days. The patient reports that she took the first 2 days of pills and then started having some vaginal itching. She reports initially she was using Paxil and then took some Monistat today. She reports that currently the itching and stinging is improved. She reports that her blood sugars have been good in control. She reports that this is been going on for the past week. She initially received the pills from [facility]. The patient denies any vomiting or nausea, no abdominal pain no chest pain or shortness of breath.   Past Medical History  Diagnosis Date  . Diabetes (HCC)     There are no active problems to display for this patient.   History reviewed. No pertinent past surgical history.  Current Outpatient Rx  Name  Route  Sig  Dispense  Refill  . FLUoxetine (PROZAC) 20 MG capsule   Oral   Take 20 mg by mouth daily.         Marland Kitchen glipiZIDE (GLUCOTROL) 10 MG tablet   Oral   Take 10 mg by mouth daily before breakfast.         . metFORMIN (GLUCOPHAGE) 1000 MG tablet   Oral   Take 1 tablet (1,000 mg total) by mouth 2 (two) times daily with a meal.   60 tablet   0   . ondansetron (ZOFRAN ODT) 8 MG disintegrating tablet   Oral   Take 1 tablet (8 mg total) by mouth every 8 (eight) hours as needed for nausea or vomiting.   20 tablet   0     Allergies Review of patient's allergies indicates no known allergies.  Family History  Problem Relation Age of Onset  . Diabetes Mother     Social History Social  History  Substance Use Topics  . Smoking status: Never Smoker   . Smokeless tobacco: None  . Alcohol Use: No    Review of Systems Constitutional: No fever/chills Eyes: No visual changes. ENT: No sore throat. Cardiovascular: Denies chest pain. Respiratory: Denies shortness of breath. Gastrointestinal: No abdominal pain.  No nausea, no vomiting.  No diarrhea.  No constipation. Genitourinary: Vaginal itching Musculoskeletal: Negative for back pain. Skin: Negative for rash. Neurological: Negative for headaches, focal weakness or numbness.  10-point ROS otherwise negative.  ____________________________________________   PHYSICAL EXAM:  VITAL SIGNS: ED Triage Vitals  Enc Vitals Group     BP 02/24/15 0000 143/93 mmHg     Pulse Rate 02/24/15 0000 74     Resp 02/24/15 0000 20     Temp 02/24/15 0000 98.4 F (36.9 C)     Temp Source 02/24/15 0000 Oral     SpO2 02/24/15 0000 100 %     Weight 02/24/15 0000 195 lb (88.451 kg)     Height 02/24/15 0000 5' (1.524 m)     Head Cir --      Peak Flow --      Pain Score --      Pain Loc --      Pain Edu? --  Excl. in GC? --     Constitutional: Alert and oriented. Well appearing and in no acute distress. Eyes: Conjunctivae are normal. PERRL. EOMI. Head: Atraumatic. Nose: No congestion/rhinnorhea. Mouth/Throat: Mucous membranes are moist.  Oropharynx non-erythematous. Cardiovascular: Normal rate, regular rhythm. Grossly normal heart sounds.  Good peripheral circulation. Respiratory: Normal respiratory effort.  No retractions. Lungs CTAB. Gastrointestinal: Soft and nontender. No distention. Positive bowel sounds Genitourinary: Patient refused Musculoskeletal: No lower extremity tenderness nor edema.   Neurologic:  Normal speech and language.  Skin:  Skin is warm, dry and intact.  Psychiatric: Mood and affect are normal.   ____________________________________________   LABS (all labs ordered are listed, but only abnormal  results are displayed)  Labs Reviewed  WET PREP, GENITAL  CHLAMYDIA/NGC RT PCR ([facility] ONLY)  URINALYSIS COMPLETEWITH MICROSCOPIC ([facility] ONLY)  CBG MONITORING, ED   ____________________________________________  EKG  None ____________________________________________  RADIOLOGY  None ____________________________________________   PROCEDURES  Procedure(s) performed: None  Critical Care performed: No  ____________________________________________   INITIAL IMPRESSION / ASSESSMENT AND PLAN / ED COURSE  Pertinent labs & imaging results that were available during my care of the patient were reviewed by me and considered in my medical decision making (see chart for details).  This is a 24 year old female who comes in with vaginal itching. When I walked into the room the patient reports that her pain and itching is improved. I asked her if she wanted me to fully examine her and she reports no. The patient reports that her ride has to go to work and that she has a OB/GYN appointment on Wednesday. I will discharge the patient to home. I informed her that she should continue to have her vaginal itching evaluated. Otherwise the patient had no further complaints or concerns. She decided that she wanted to go home and follow up with her OB/GYN as she has scheduled. ____________________________________________   FINAL CLINICAL IMPRESSION(S) / ED DIAGNOSES  Final diagnoses:  Vaginitis      Rebecka Apley, MD 02/24/15 0403

## 2015-11-29 ENCOUNTER — Encounter: Payer: Self-pay | Admitting: Emergency Medicine

## 2015-11-29 ENCOUNTER — Emergency Department: Payer: Self-pay

## 2015-11-29 ENCOUNTER — Emergency Department
Admission: EM | Admit: 2015-11-29 | Discharge: 2015-11-29 | Disposition: A | Discharge status: Home / Self Care | Payer: Self-pay | Attending: Student | Admitting: Student

## 2015-11-29 DIAGNOSIS — Y939 Activity, unspecified: Secondary | ICD-10-CM | POA: Insufficient documentation

## 2015-11-29 DIAGNOSIS — E119 Type 2 diabetes mellitus without complications: Secondary | ICD-10-CM | POA: Insufficient documentation

## 2015-11-29 DIAGNOSIS — Y92009 Unspecified place in unspecified non-institutional (private) residence as the place of occurrence of the external cause: Secondary | ICD-10-CM | POA: Insufficient documentation

## 2015-11-29 DIAGNOSIS — Z79899 Other long term (current) drug therapy: Secondary | ICD-10-CM | POA: Insufficient documentation

## 2015-11-29 DIAGNOSIS — W1839XA Other fall on same level, initial encounter: Secondary | ICD-10-CM | POA: Insufficient documentation

## 2015-11-29 DIAGNOSIS — S93402A Sprain of unspecified ligament of left ankle, initial encounter: Secondary | ICD-10-CM | POA: Insufficient documentation

## 2015-11-29 DIAGNOSIS — Y999 Unspecified external cause status: Secondary | ICD-10-CM | POA: Insufficient documentation

## 2015-11-29 DIAGNOSIS — S80812A Abrasion, left lower leg, initial encounter: Secondary | ICD-10-CM | POA: Insufficient documentation

## 2015-11-29 DIAGNOSIS — Z7984 Long term (current) use of oral hypoglycemic drugs: Secondary | ICD-10-CM | POA: Insufficient documentation

## 2015-11-29 MED ORDER — HYDROMORPHONE HCL 1 MG/ML IJ SOLN
1.0000 mg | Freq: Once | INTRAMUSCULAR | Status: AC
  Administered 2015-11-29: 1 mg via INTRAMUSCULAR
  Filled 2015-11-29: qty 1

## 2015-11-29 MED ORDER — CEPHALEXIN 500 MG PO CAPS: 500.0000 mg | ORAL_CAPSULE | Freq: Four times a day (QID) | ORAL | Status: DC

## 2015-11-29 MED ORDER — NEOMYCIN-POLYMYXIN-PRAMOXINE 1 % EX CREA: TOPICAL_CREAM | Freq: Two times a day (BID) | CUTANEOUS | Status: DC

## 2015-11-29 MED ORDER — TRAMADOL HCL 50 MG PO TABS: 50.0000 mg | ORAL_TABLET | Freq: Four times a day (QID) | ORAL | Status: AC | PRN

## 2015-11-29 MED ORDER — BACITRACIN ZINC 500 UNIT/GM EX OINT
TOPICAL_OINTMENT | Freq: Two times a day (BID) | CUTANEOUS | Status: DC
  Administered 2015-11-29: 1 via TOPICAL

## 2015-11-29 MED ORDER — IBUPROFEN 600 MG PO TABS: 600.0000 mg | ORAL_TABLET | Freq: Three times a day (TID) | ORAL | Status: DC | PRN

## 2015-11-29 MED ORDER — BACITRACIN ZINC 500 UNIT/GM EX OINT
TOPICAL_OINTMENT | CUTANEOUS | Status: AC
  Filled 2015-11-29: qty 0.9

## 2015-11-29 MED ORDER — ACETAMINOPHEN 500 MG PO TABS
ORAL_TABLET | ORAL | Status: AC
  Filled 2015-11-29: qty 2

## 2015-11-29 NOTE — ED Provider Notes (Signed)
[facility] Emergency Department Provider Note   ____________________________________________  Time seen: Approximately 4:06 PM  I have reviewed the triage vital signs and the nursing notes.   HISTORY  Chief Complaint Extremity Laceration    HPI Carolyn Andrews is a 25 y.o. female patient complaining of left lower extremity pain secondary to a fall. Patient state the vent cover of a dog broke her leg went through the vent. Patient has abrasions to the lateral aspect of her left leg. Patient also has ecchymosis and edema to the lateral aspect of the left ankle. Patient is a pain increases with ambulation. No palliative measures taken prior to arrival. Patient rates her pain discomfort as a 9/10. Patient describes the pain as "sharp". Patient also states describes burning sensation to the abrasions area of her left leg.   Past Medical History  Diagnosis Date  . Diabetes (HCC)     There are no active problems to display for this patient.   History reviewed. No pertinent past surgical history.  Current Outpatient Rx  Name  Route  Sig  Dispense  Refill  . FLUoxetine (PROZAC) 20 MG capsule   Oral   Take 20 mg by mouth daily.         Marland Kitchen glipiZIDE (GLUCOTROL) 10 MG tablet   Oral   Take 10 mg by mouth daily before breakfast.         . ibuprofen (ADVIL,MOTRIN) 600 MG tablet   Oral   Take 1 tablet (600 mg total) by mouth every 8 (eight) hours as needed.   15 tablet   0   . EXPIRED: metFORMIN (GLUCOPHAGE) 1000 MG tablet   Oral   Take 1 tablet (1,000 mg total) by mouth 2 (two) times daily with a meal.   60 tablet   0   . neomycin-polymyxin-pramoxine (NEOSPORIN PLUS) 1 % cream   Topical   Apply topically 2 (two) times daily.   14.2 g   0   . ondansetron (ZOFRAN ODT) 8 MG disintegrating tablet   Oral   Take 1 tablet (8 mg total) by mouth every 8 (eight) hours as needed for nausea or vomiting.   20 tablet   0   . traMADol (ULTRAM)  50 MG tablet   Oral   Take 1 tablet (50 mg total) by mouth every 6 (six) hours as needed.   20 tablet   0     Allergies Review of patient's allergies indicates no known allergies.  Family History  Problem Relation Age of Onset  . Diabetes Mother     Social History Social History  Substance Use Topics  . Smoking status: Never Smoker   . Smokeless tobacco: None  . Alcohol Use: No    Review of Systems Constitutional: No fever/chills Eyes: No visual changes. ENT: No sore throat. Cardiovascular: Denies chest pain. Respiratory: Denies shortness of breath. Gastrointestinal: No abdominal pain.  No nausea, no vomiting.  No diarrhea.  No constipation. Genitourinary: Negative for dysuria. Musculoskeletal:Left ankle pain. Skin: Negative for rash. Abrasions Neurological: Negative for headaches, focal weakness or numbness.    ____________________________________________   PHYSICAL EXAM:  VITAL SIGNS: ED Triage Vitals  Enc Vitals Group     BP 11/29/15 1558 154/92 mmHg     Pulse Rate 11/29/15 1558 87     Resp 11/29/15 1558 16     Temp 11/29/15 1558 98.1 F (36.7 C)     Temp Source 11/29/15 1558 Oral     SpO2  11/29/15 1558 99 %     Weight 11/29/15 1558 195 lb (88.451 kg)     Height 11/29/15 1558 5' (1.524 m)     Head Cir --      Peak Flow --      Pain Score 11/29/15 1555 9     Pain Loc --      Pain Edu? --      Excl. in GC? --     Constitutional: Alert and oriented. Well appearing and in no acute distress. Eyes: Conjunctivae are normal. PERRL. EOMI. Head: Atraumatic. Nose: No congestion/rhinnorhea. Mouth/Throat: Mucous membranes are moist.  Oropharynx non-erythematous. Neck: No stridor. No cervical spine tenderness to palpation. Hematological/Lymphatic/Immunilogical: No cervical lymphadenopathy. Cardiovascular: Normal rate, regular rhythm. Grossly normal heart sounds.  Good peripheral circulation. Respiratory: Normal respiratory effort.  No retractions. Lungs  CTAB. Gastrointestinal: Soft and nontender. No distention. No abdominal bruits. No CVA tenderness. Musculoskeletal: Edema and ecchymosis lateral malleolus left leg.  Neurologic:  Normal speech and language. No gross focal neurologic deficits are appreciated. No gait instability. Skin: Large abrasion left lateral leg . Psychiatric: Mood and affect are normal. Speech and behavior are normal.  ____________________________________________   LABS (all labs ordered are listed, but only abnormal results are displayed)  Labs Reviewed - No data to display ____________________________________________  EKG   ____________________________________________  RADIOLOGY  No acute findings on x-ray of the left ankle. ____________________________________________   PROCEDURES  Procedure(s) performed: None  Procedures  Critical Care performed: No  ____________________________________________   INITIAL IMPRESSION / ASSESSMENT AND PLAN / ED COURSE  Pertinent labs & imaging results that were available during my care of the patient were reviewed by me and considered in my medical decision making (see chart for details).  Abrasions left lower lateral leg. Left ankle sprain. Discussed negative x-ray findings with patient. Patient given discharge care instructions. Patient given a prescription for ibuprofen and tramadol. Patient placed in a Velcro ankle splint. [facility] condition [facility]. ____________________________________________   FINAL CLINICAL IMPRESSION(S) / ED DIAGNOSES  Final diagnoses:  Left ankle sprain, initial encounter  Abrasion of left leg, initial encounter      NEW MEDICATIONS STARTED DURING THIS VISIT:  New Prescriptions   IBUPROFEN (ADVIL,MOTRIN) 600 MG TABLET    Take 1 tablet (600 mg total) by mouth every 8 (eight) hours as needed.   NEOMYCIN-POLYMYXIN-PRAMOXINE (NEOSPORIN PLUS) 1 % CREAM    Apply topically 2 (two)  times daily.   TRAMADOL (ULTRAM) 50 MG TABLET    Take 1 tablet (50 mg total) by mouth every 6 (six) hours as needed.     Note:  This document was prepared using Dragon voice recognition software and may include unintentional dictation errors.    Joni Reiningonald K Burech Mcfarland, PA-C 11/29/15 1708  Gayla DossEryka A Gayle, MD 11/30/15 [phone]

## 2015-11-29 NOTE — Discharge Instructions (Signed)
Ankle Sprain An ankle sprain is an injury to the strong, fibrous tissues (ligaments) that hold your ankle bones together.  HOME CARE   Put ice on your ankle for 1-2 days or as told by your doctor.  Put ice in a plastic bag.  Place a towel between your skin and the bag.  Leave the ice on for 15-20 minutes at a time, every 2 hours while you are awake.  Only take medicine as told by your doctor.  Raise (elevate) your injured ankle above the level of your heart as much as possible for 2-3 days.  Use crutches if your doctor tells you to. Slowly put your own weight on the affected ankle. Use the crutches until you can walk without pain.  If you have a plaster splint:  Do not rest it on anything harder than a pillow for 24 hours.  Do not put weight on it.  Do not get it wet.  Take it off to shower or bathe.  If given, use an elastic wrap or support stocking for support. Take the wrap off if your toes lose feeling (numb), tingle, or turn cold or blue.  If you have an air splint:  Add or let out air to make it comfortable.  Take it off at night and to shower and bathe.  Wiggle your toes and move your ankle up and down often while you are wearing it. GET HELP IF:  You have rapidly increasing bruising or puffiness (swelling).  Your toes feel very cold.  You lose feeling in your foot.  Your medicine does not help your pain. GET HELP RIGHT AWAY IF:   Your toes lose feeling (numb) or turn blue.  You have severe pain that is increasing. MAKE SURE YOU:   Understand these instructions.  Will watch your condition.  Will get help right away if you are not doing well or get worse.   This information is not intended to replace advice given to you by your health care provider. Make sure you discuss any questions you have with your health care provider.   Document Released: 10/27/2007 Document Revised: 05/31/2014 Document Reviewed: 11/22/2011 Elsevier Interactive Patient  Education 2016 [employer]  Abrasion An abrasion is a cut or scrape on the surface of your skin. An abrasion does not go through all of the layers of your skin. It is important to take good care of your abrasion to prevent infection. HOME CARE Medicines  Take or apply medicines only as told by your doctor.  If you were prescribed an antibiotic ointment, finish all of it even if you start to feel better. Wound Care  Clean the wound with mild soap and water 2-3 times per day or as told by your doctor. Pat your wound dry with a clean towel. Do not rub it.  There are many ways to close and cover a wound. Follow instructions from your doctor about:  How to take care of your wound.  When and how you should change your bandage (dressing).  When and how you should take off your dressing.  Check your wound every day for signs of infection. Watch for:  Redness, swelling, or pain.  Fluid, blood, or pus. General Instructions  Keep the dressing dry as told by your doctor. Do not take baths, swim, use a hot tub, or do anything that would put your wound underwater until your doctor says it is okay.  If there is swelling, raise (elevate) the injured area above  the level of your heart while you are sitting or lying down.  Keep all follow-up visits as told by your doctor. This is important. GET HELP IF:  You were given a tetanus shot and you have any of these where the needle went in:  Swelling.  Very bad pain.  Redness.  Bleeding.  Medicine does not help your pain.  You have any of these at the site of the wound:  More redness.  More swelling.  More pain. GET HELP RIGHT AWAY IF:  You have a red streak going away from your wound.  You have a fever.  You have fluid, blood, or pus coming from your wound.  There is a bad smell coming from your wound.   This information is not intended to replace advice given to you by your health care provider. Make sure you discuss any  questions you have with your health care provider.   Document Released: 10/27/2007 Document Revised: 09/24/2014 Document Reviewed: 05/08/2014 Elsevier Interactive Patient Education [employer]

## 2015-11-29 NOTE — ED Notes (Signed)
Pt arrived by POV to ED after falling into the duct of her home after the vent cover broke. Pt has a large abrasion to the outside of her left leg, a small cut on the inside of her left leg about the ankle and bruising around the ankle.

## 2017-01-19 ENCOUNTER — Emergency Department
Admission: EM | Admit: 2017-01-19 | Discharge: 2017-01-19 | Disposition: A | Discharge status: Home / Self Care | Payer: Self-pay | Attending: Emergency Medicine | Admitting: Emergency Medicine

## 2017-01-19 ENCOUNTER — Encounter: Payer: Self-pay | Admitting: Emergency Medicine

## 2017-01-19 ENCOUNTER — Emergency Department: Payer: Self-pay

## 2017-01-19 DIAGNOSIS — O200 Threatened abortion: Secondary | ICD-10-CM | POA: Insufficient documentation

## 2017-01-19 DIAGNOSIS — E119 Type 2 diabetes mellitus without complications: Secondary | ICD-10-CM | POA: Insufficient documentation

## 2017-01-19 DIAGNOSIS — Z79899 Other long term (current) drug therapy: Secondary | ICD-10-CM | POA: Insufficient documentation

## 2017-01-19 DIAGNOSIS — Z3A00 Weeks of gestation of pregnancy not specified: Secondary | ICD-10-CM | POA: Insufficient documentation

## 2017-01-19 DIAGNOSIS — Z7984 Long term (current) use of oral hypoglycemic drugs: Secondary | ICD-10-CM | POA: Insufficient documentation

## 2017-01-19 LAB — COMPREHENSIVE METABOLIC PANEL
ALT: 69 U/L — AB (ref 14–54)
AST: 47 U/L — ABNORMAL HIGH (ref 15–41)
Albumin: 3.5 g/dL (ref 3.5–5.0)
Alkaline Phosphatase: 57 U/L (ref 38–126)
Anion gap: 7 (ref 5–15)
BILIRUBIN TOTAL: 0.7 mg/dL (ref 0.3–1.2)
BUN: 14 mg/dL (ref 6–20)
CO2: 26 mmol/L (ref 22–32)
CREATININE: 0.55 mg/dL (ref 0.44–1.00)
Calcium: 9 mg/dL (ref 8.9–10.3)
Chloride: 104 mmol/L (ref 101–111)
Glucose, Bld: 256 mg/dL — ABNORMAL HIGH (ref 65–99)
POTASSIUM: 4.1 mmol/L (ref 3.5–5.1)
Sodium: 137 mmol/L (ref 135–145)
TOTAL PROTEIN: 7.5 g/dL (ref 6.5–8.1)

## 2017-01-19 LAB — CBC
HEMATOCRIT: 40.8 % (ref 35.0–47.0)
Hemoglobin: 13.7 g/dL (ref 12.0–16.0)
MCH: 29.7 pg (ref 26.0–34.0)
MCHC: 33.7 g/dL (ref 32.0–36.0)
MCV: 88.3 fL (ref 80.0–100.0)
PLATELETS: 241 10*3/uL (ref 150–440)
RBC: 4.62 MIL/uL (ref 3.80–5.20)
RDW: 13 % (ref 11.5–14.5)
WBC: 12.3 10*3/uL — AB (ref 3.6–11.0)

## 2017-01-19 LAB — HCG, QUANTITATIVE, PREGNANCY: HCG, BETA CHAIN, QUANT, S: 1187 m[IU]/mL — AB (ref <5)

## 2017-01-19 LAB — ABO/RH: ABO/RH(D): O POS

## 2017-01-19 MED ORDER — ONDANSETRON HCL 4 MG/2ML IJ SOLN
4.0000 mg | Freq: Once | INTRAMUSCULAR | Status: DC
  Filled 2017-01-19: qty 2

## 2017-01-19 MED ORDER — MORPHINE SULFATE (PF) 2 MG/ML IV SOLN
2.0000 mg | Freq: Once | INTRAVENOUS | Status: DC
  Filled 2017-01-19: qty 1

## 2017-01-19 NOTE — ED Notes (Signed)
Gave patient urine cup for a sample. Patient stated, "i don't know what you want me to do I am on my period and I am peeing blood clots." explained to patient EDP will be in soon.

## 2017-01-19 NOTE — ED Notes (Signed)
Patient reports last period was June. Has irregular periods. Patient reports started period Sunday and today was her heaviest day. Patient having RUQ pain. Patient is tender in the RUQ.

## 2017-01-19 NOTE — ED Triage Notes (Signed)
Patient ambulatory to triage with steady gait, without difficulty or distress noted; pt reports mid abd pain since afternoon with heavy vag bleeding

## 2017-01-19 NOTE — ED Provider Notes (Signed)
[facility] Emergency Department Provider Note    First MD Initiated Contact with Patient 01/19/17 0405     (approximate)  I have reviewed the triage vital signs and the nursing notes.   HISTORY  Chief Complaint Vaginal Bleeding   HPI Carolyn Andrews is a 26 y.o. female G1 P0  with one previous miscarriage presents to the emergency department with "heavy vaginal bleeding since yesterday afternoon. Patient states "my period started on Sunday but my bleeding became very heavy yesterday". Patient states her last Menses was June. Patient admits to irregular menses stating that this is "normal for her"   Past Medical History:  Diagnosis Date  . Diabetes (HCC)     There are no active problems to display for this patient.   History reviewed. No pertinent surgical history.  Prior to Admission medications   Medication Sig Start Date End Date Taking? Authorizing Provider  cephALEXin (KEFLEX) 500 MG capsule Take 1 capsule (500 mg total) by mouth 4 (four) times daily. 11/29/15   Joni ReiningSmith, Ronald K, PA-C  FLUoxetine (PROZAC) 20 MG capsule Take 20 mg by mouth daily.    [provider]  glipiZIDE (GLUCOTROL) 10 MG tablet Take 10 mg by mouth daily before breakfast.    [provider]  ibuprofen (ADVIL,MOTRIN) 600 MG tablet Take 1 tablet (600 mg total) by mouth every 8 (eight) hours as needed. 11/29/15   Joni ReiningSmith, Ronald K, PA-C  metFORMIN (GLUCOPHAGE) 1000 MG tablet Take 1 tablet (1,000 mg total) by mouth 2 (two) times daily with a meal. 10/30/14 10/30/15  Rebecka ApleyWebster, Allison P, MD  neomycin-polymyxin-pramoxine (NEOSPORIN PLUS) 1 % cream Apply topically 2 (two) times daily. 11/29/15   Joni ReiningSmith, Ronald K, PA-C  ondansetron (ZOFRAN ODT) 8 MG disintegrating tablet Take 1 tablet (8 mg total) by mouth every 8 (eight) hours as needed for nausea or vomiting. 12/28/14   Sharman CheekStafford, Phillip, MD    Allergies Patient has no known allergies.  Family History  Problem  Relation Age of Onset  . Diabetes Mother     Social History Social History  Substance Use Topics  . Smoking status: Never Smoker  . Smokeless tobacco: Never Used  . Alcohol use No    Review of Systems Constitutional: No fever/chills Eyes: No visual changes. ENT: No sore throat. Cardiovascular: Denies chest pain. Respiratory: Denies shortness of breath. Gastrointestinal: No abdominal pain.  No nausea, no vomiting.  No diarrhea.  No constipation. Genitourinary: Negative for dysuria.Positive for vaginal bleeding Musculoskeletal: Negative for neck pain.  Negative for back pain. Integumentary: Negative for rash. Neurological: Negative for headaches, focal weakness or numbness.   ____________________________________________   PHYSICAL EXAM:  VITAL SIGNS: ED Triage Vitals  Enc Vitals Group     BP 01/19/17 0031 136/71     Pulse Rate 01/19/17 0031 60     Resp 01/19/17 0031 18     Temp 01/19/17 0031 97.9 F (36.6 C)     Temp Source 01/19/17 0031 Oral     SpO2 01/19/17 0031 100 %     Weight 01/19/17 0029 88 kg (194 lb)     Height 01/19/17 0029 1.524 m (5')     Head Circumference --      Peak Flow --      Pain Score 01/19/17 0029 10     Pain Loc --      Pain Edu? --      Excl. in GC? --     Constitutional: Alert and oriented. Well  appearing and in no acute distress. Eyes: Conjunctivae are normal.  Head: Atraumatic. Mouth/Throat: Mucous membranes are moist.  Oropharynx non-erythematous. Neck: No stridor.   Cardiovascular: Normal rate, regular rhythm. Good peripheral circulation. Grossly normal heart sounds. Respiratory: Normal respiratory effort.  No retractions. Lungs CTAB. Gastrointestinal: Soft and nontender. No distention.  Musculoskeletal: No lower extremity tenderness nor edema. No gross deformities of extremities. Neurologic:  Normal speech and language. No gross focal neurologic deficits are appreciated.  Skin:  Skin is warm, dry and intact. No rash  noted. Psychiatric: Mood and affect are normal. Speech and behavior are normal.  ____________________________________________   LABS (all labs ordered are listed, but only abnormal results are displayed)  Labs Reviewed  CBC - Abnormal; Notable for the following:       Result Value   WBC 12.3 (*)    All other components within normal limits  COMPREHENSIVE METABOLIC PANEL - Abnormal; Notable for the following:    Glucose, Bld 256 (*)    AST 47 (*)    ALT 69 (*)    All other components within normal limits  HCG, QUANTITATIVE, PREGNANCY - Abnormal; Notable for the following:    hCG, Beta Chain, Quant, S 1,187 (*)    All other components within normal limits  URINALYSIS, COMPLETE (UACMP) WITH MICROSCOPIC  ABO/RH   _________________________________________ ____________________________________________  RADIOLOGY I, Darci Current, personally viewed and evaluated these images (plain radiographs) as part of my medical decision making, as well as reviewing the written report by the radiologist.  US Ob Comp Less 14 Wks  Result Date: 01/19/2017 CLINICAL DATA:  Heavy vaginal bleeding. EXAM: OBSTETRIC <14 WK [place] AND TRANSVAGINAL OB US TECHNIQUE: Both transabdominal and transvaginal ultrasound examinations were performed for complete evaluation of the gestation as well as the maternal uterus, adnexal regions, and pelvic cul-de-sac. Transvaginal technique was performed to assess early pregnancy. COMPARISON:  No recent prior . FINDINGS: Intrauterine gestational sac: None. Yolk sac:  None. Embryo:  None. Cardiac Activity: None. Maternal uterus/adnexae: Endometrium is thickened at 1.8 cm. Complex fluid noted in the endometrial canal. The fluid extends into the cervix. IMPRESSION: Thickened endometrium with complex fluid noted in the endometrial canal. Fluid extends into the cervix. Findings suggest and aborting pregnancy. Findings are suspicious but not yet definitive for failed pregnancy. Recommend  follow-up US in 10-14 days for definitive diagnosis. This recommendation follows SRU consensus guidelines: Diagnostic Criteria for Nonviable Pregnancy Early in the First Trimester. Malva Limes Med 2013; 147:8295-62. Electronically Signed   By: Maisie Fus  Register   On: 01/19/2017 07:01   US Ob Transvaginal  Result Date: 01/19/2017 CLINICAL DATA:  Heavy vaginal bleeding. EXAM: OBSTETRIC <14 WK [place] AND TRANSVAGINAL OB US TECHNIQUE: Both transabdominal and transvaginal ultrasound examinations were performed for complete evaluation of the gestation as well as the maternal uterus, adnexal regions, and pelvic cul-de-sac. Transvaginal technique was performed to assess early pregnancy. COMPARISON:  No recent prior . FINDINGS: Intrauterine gestational sac: None. Yolk sac:  None. Embryo:  None. Cardiac Activity: None. Maternal uterus/adnexae: Endometrium is thickened at 1.8 cm. Complex fluid noted in the endometrial canal. The fluid extends into the cervix. IMPRESSION: Thickened endometrium with complex fluid noted in the endometrial canal. Fluid extends into the cervix. Findings suggest and aborting pregnancy. Findings are suspicious but not yet definitive for failed pregnancy. Recommend follow-up US in 10-14 days for definitive diagnosis. This recommendation follows SRU consensus guidelines: Diagnostic Criteria for Nonviable Pregnancy Early in the First Trimester. N Engl J Med  2013; 161:0960-45. Electronically Signed   By: Maisie Fus  Register   On: 01/19/2017 07:01   US Abdomen Limited Ruq  Result Date: 01/19/2017 CLINICAL DATA:  Heavy vaginal bleeding. EXAM: OBSTETRIC <14 WK [place] AND TRANSVAGINAL OB US TECHNIQUE: Both transabdominal and transvaginal ultrasound examinations were performed for complete evaluation of the gestation as well as the maternal uterus, adnexal regions, and pelvic cul-de-sac. Transvaginal technique was performed to assess early pregnancy. COMPARISON:  No recent prior . FINDINGS: Intrauterine gestational  sac: None. Yolk sac:  None. Embryo:  None. Cardiac Activity: None. Maternal uterus/adnexae: Endometrium is thickened at 1.8 cm. Complex fluid noted in the endometrial canal. The fluid extends into the cervix. IMPRESSION: Thickened endometrium with complex fluid noted in the endometrial canal. Fluid extends into the cervix. Findings suggest and aborting pregnancy. Findings are suspicious but not yet definitive for failed pregnancy. Recommend follow-up US in 10-14 days for definitive diagnosis. This recommendation follows SRU consensus guidelines: Diagnostic Criteria for Nonviable Pregnancy Early in the First Trimester. Malva Limes Med 2013; 409:8119-14. Electronically Signed   By: Maisie Fus  Register   On: 01/19/2017 07:01     Procedures   ____________________________________________   INITIAL IMPRESSION / ASSESSMENT AND PLAN / ED COURSE  Pertinent labs & imaging results that were available during my care of the patient were reviewed by me and considered in my medical decision making (see chart for details).  26 year old female presenting the emergency department with heavy vaginal bleeding since yesterday afternoon. History of physical exam concerning for possible pregnancy with potential impending miscarriage. Ultrasound revealed above-stated findings. I spoke with the patient at length regarding the ultrasound findings and necessity of following up with OB/GYN. Awaiting ABO and Rh at this time      ____________________________________________  FINAL CLINICAL IMPRESSION(S) / ED DIAGNOSES  Final diagnoses:  Threatened miscarriage     MEDICATIONS GIVEN DURING THIS VISIT:  Medications  morphine 2 MG/ML injection 2 mg (not administered)  ondansetron (ZOFRAN) injection 4 mg (not administered)     NEW OUTPATIENT MEDICATIONS STARTED DURING THIS VISIT:  New Prescriptions   No medications on file    Modified Medications   No medications on file    Discontinued Medications   No  medications on file     Note:  This document was prepared using Dragon voice recognition software and may include unintentional dictation errors.    Darci Current, MD 01/19/17 [phone]

## 2017-07-13 ENCOUNTER — Other Ambulatory Visit: Payer: Self-pay

## 2017-07-13 ENCOUNTER — Emergency Department
Admission: EM | Admit: 2017-07-13 | Discharge: 2017-07-13 | Disposition: A | Discharge status: Home / Self Care | Payer: Self-pay | Attending: Emergency Medicine | Admitting: Emergency Medicine

## 2017-07-13 DIAGNOSIS — Z7984 Long term (current) use of oral hypoglycemic drugs: Secondary | ICD-10-CM | POA: Insufficient documentation

## 2017-07-13 DIAGNOSIS — E119 Type 2 diabetes mellitus without complications: Secondary | ICD-10-CM | POA: Insufficient documentation

## 2017-07-13 DIAGNOSIS — M5431 Sciatica, right side: Secondary | ICD-10-CM | POA: Insufficient documentation

## 2017-07-13 DIAGNOSIS — M5432 Sciatica, left side: Secondary | ICD-10-CM | POA: Insufficient documentation

## 2017-07-13 LAB — POCT PREGNANCY, URINE: Preg Test, Ur: NEGATIVE

## 2017-07-13 LAB — URINALYSIS, COMPLETE (UACMP) WITH MICROSCOPIC
Bilirubin Urine: NEGATIVE
Glucose, UA: >=500 mg/dL — AB
Hgb urine dipstick: NEGATIVE
Ketones, ur: NEGATIVE mg/dL
Leukocytes, UA: NEGATIVE
Nitrite: NEGATIVE
Protein, ur: NEGATIVE mg/dL
Specific Gravity, Urine: 1.026 (ref 1.005–1.030)
pH: 6 (ref 5.0–8.0)

## 2017-07-13 MED ORDER — CYCLOBENZAPRINE HCL 5 MG PO TABS: 5.0000 mg | ORAL_TABLET | Freq: Three times a day (TID) | ORAL | 0 refills | Status: AC | PRN

## 2017-07-13 MED ORDER — METHOCARBAMOL 500 MG PO TABS
1000.0000 mg | ORAL_TABLET | Freq: Once | ORAL | Status: AC
  Administered 2017-07-13: 1000 mg via ORAL
  Filled 2017-07-13: qty 2

## 2017-07-13 MED ORDER — PREDNISONE 50 MG PO TABS: ORAL_TABLET | ORAL | 0 refills | Status: DC

## 2017-07-13 NOTE — ED Notes (Signed)
Pt. Verbalizes understanding of d/c instructions, medications, and follow-up. VS stable.  Pt. In NAD at time of d/c and denies further concerns regarding this visit. Pt. Stable at the time of departure from the unit, departing unit by the safest and most appropriate manner per that pt condition and limitations with all belongings accounted for. Pt advised to return to the ED at any time for emergent concerns, or for new/worsening symptoms.   

## 2017-07-13 NOTE — ED Triage Notes (Signed)
She arrives ambulatory to triage with reports of lower back pain for the last two weeks  She denies injury - verbalizes that sometimes the pain shoots down her right leg and the left side of her neck hurts at times

## 2017-07-13 NOTE — ED Notes (Signed)
See triage note states she has had lower back pain for the past 2-3 weeks   States pain occasionally radiates into bilateral hip area  Ambulates well to treatment room  Unsure any injury  [facility] and dx'd with muscle strain  Has been using ibu w/o releif

## 2017-07-13 NOTE — ED Provider Notes (Signed)
[facility] Emergency Department Provider Note  ____________________________________________  Time seen: Approximately 11:57 PM  I have reviewed the triage vital signs and the nursing notes.   HISTORY  Chief Complaint Back Pain; Neck Pain; and Leg Pain    HPI Carolyn Andrews is a 27 y.o. female presents to the emergency department with low back pain for the past 3 weeks.  Patient reports that pain radiates into the bilateral posterior legs and stops at the knees.  She denies heavy lifting at work and at home.  She denies falls or mechanisms of trauma.  Patient has a history of obesity. Patient has been taking ibuprofen. She denies dysuria, increased urinary frequency or hematuria.  [facility] and [facility] with a muscle strain.   Past Medical History:  Diagnosis Date  . Diabetes (HCC)     There are no active problems to display for this patient.   No past surgical history on file.  Prior to Admission medications   Medication Sig Start Date End Date Taking? Authorizing Provider  cephALEXin (KEFLEX) 500 MG capsule Take 1 capsule (500 mg total) by mouth 4 (four) times daily. 11/29/15   Joni ReiningSmith, Ronald K, PA-C  cyclobenzaprine (FLEXERIL) 5 MG tablet Take 1 tablet (5 mg total) by mouth 3 (three) times daily as needed for up to 3 days for muscle spasms. 07/13/17 07/16/17  Orvil FeilWoods, Milicent Acheampong M, PA-C  FLUoxetine (PROZAC) 20 MG capsule Take 20 mg by mouth daily.    [provider]  glipiZIDE (GLUCOTROL) 10 MG tablet Take 10 mg by mouth daily before breakfast.    [provider]  ibuprofen (ADVIL,MOTRIN) 600 MG tablet Take 1 tablet (600 mg total) by mouth every 8 (eight) hours as needed. 11/29/15   Joni ReiningSmith, Ronald K, PA-C  metFORMIN (GLUCOPHAGE) 1000 MG tablet Take 1 tablet (1,000 mg total) by mouth 2 (two) times daily with a meal. 10/30/14 10/30/15  Rebecka ApleyWebster, Allison P, MD  neomycin-polymyxin-pramoxine (NEOSPORIN PLUS) 1 % cream Apply topically 2  (two) times daily. 11/29/15   Joni ReiningSmith, Ronald K, PA-C  ondansetron (ZOFRAN ODT) 8 MG disintegrating tablet Take 1 tablet (8 mg total) by mouth every 8 (eight) hours as needed for nausea or vomiting. 12/28/14   Sharman CheekStafford, Phillip, MD  predniSONE (DELTASONE) 50 MG tablet Take one 50 mg tablet once a day for 5 days. 07/13/17   Orvil FeilWoods, Pio Eatherly M, PA-C    Allergies Patient has no known allergies.  Family History  Problem Relation Age of Onset  . Diabetes Mother     Social History Social History   Tobacco Use  . Smoking status: Never Smoker  . Smokeless tobacco: Never Used  Substance Use Topics  . Alcohol use: No  . Drug use: Not on file     Review of Systems  Constitutional: No fever/chills Eyes: No visual changes. No discharge ENT: No upper respiratory complaints. Cardiovascular: no chest pain. Respiratory: no cough. No SOB. Gastrointestinal: No abdominal pain.  No nausea, no vomiting.  No diarrhea.  No constipation. Genitourinary: Negative for dysuria. No hematuria Musculoskeletal: Patient has low back pain.  Skin: Negative for rash, abrasions, lacerations, ecchymosis. Neurological: Negative for headaches, focal weakness or numbness. ____________________________________________   PHYSICAL EXAM:  VITAL SIGNS: ED Triage Vitals  Enc Vitals Group     BP 07/13/17 1653 (!) 150/69     Pulse Rate 07/13/17 1653 88     Resp 07/13/17 1653 17     Temp 07/13/17 1653 98.3 F (  36.8 C)     Temp src --      SpO2 07/13/17 1653 100 %     Weight 07/13/17 1654 210 lb (95.3 kg)     Height 07/13/17 1654 5' (1.524 m)     Head Circumference --      Peak Flow --      Pain Score 07/13/17 1654 9     Pain Loc --      Pain Edu? --      Excl. in GC? --      Constitutional: Alert and oriented. Well appearing and in no acute distress. Eyes: Conjunctivae are normal. PERRL. EOMI. Head: Atraumatic. Cardiovascular: Normal rate, regular rhythm. Normal S1 and S2.  Good peripheral  circulation. Respiratory: Normal respiratory effort without tachypnea or retractions. Lungs CTAB. Good air entry to the bases with no decreased or absent breath sounds. Musculoskeletal: Positive straight leg raise test bilaterally.  Patient has paraspinal muscle tenderness along the lumbar spine. Neurologic:  Normal speech and language. No gross focal neurologic deficits are appreciated.  Skin:  Skin is warm, dry and intact. No rash noted. Psychiatric: Mood and affect are normal. Speech and behavior are normal. Patient exhibits appropriate insight and judgement.   ____________________________________________   LABS (all labs ordered are listed, but only abnormal results are displayed)  Labs Reviewed  URINALYSIS, COMPLETE (UACMP) WITH MICROSCOPIC - Abnormal; Notable for the following components:      Result Value   Color, Urine YELLOW (*)    APPearance HAZY (*)    Glucose, UA >=500 (*)    Bacteria, UA RARE (*)    Squamous Epithelial / LPF 0-5 (*)    All other components within normal limits  POC URINE PREG, ED  POCT PREGNANCY, URINE   ____________________________________________  EKG   ____________________________________________  RADIOLOGY   No results found.  ____________________________________________    PROCEDURES  Procedure(s) performed:    Procedures    Medications  methocarbamol (ROBAXIN) tablet 1,000 mg (1,000 mg Oral Given 07/13/17 1926)     ____________________________________________   INITIAL IMPRESSION / ASSESSMENT AND PLAN / ED COURSE  Pertinent labs & imaging results that were available during my care of the patient were reviewed by me and considered in my medical decision making (see chart for details).  Review of the [place] CSRS was performed in accordance of the [facility] prior to dispensing any controlled drugs.     Assessment and plan Low back pain with bilateral lower extremity radiculopathy Patient presents to the emergency department  with low back pain.  Differential diagnosis included sciatica, lumbar strain and herniated disc.  Patient was treated empirically with prednisone and Flexeril.  Vital signs are reassuring prior to discharge.  All patient questions were answered.    ____________________________________________  FINAL CLINICAL IMPRESSION(S) / ED DIAGNOSES  Final diagnoses:  Bilateral sciatica      NEW MEDICATIONS STARTED DURING THIS VISIT:  ED Discharge Orders        Ordered    predniSONE (DELTASONE) 50 MG tablet     07/13/17 1911    cyclobenzaprine (FLEXERIL) 5 MG tablet  3 times daily PRN     07/13/17 1911          This chart was dictated using voice recognition software/Dragon. Despite best efforts to proofread, errors can occur which can change the meaning. Any change was purely unintentional.    Orvil Feil, PA-C 07/14/17 0001    Pershing Proud Myra Rude, MD 07/14/17 [phone]

## 2017-07-25 ENCOUNTER — Emergency Department
Admission: EM | Admit: 2017-07-25 | Discharge: 2017-07-25 | Disposition: A | Discharge status: Home / Self Care | Payer: Self-pay | Attending: Student in an Organized Health Care Education/Training Program | Admitting: Student in an Organized Health Care Education/Training Program

## 2017-07-25 ENCOUNTER — Emergency Department: Payer: Self-pay

## 2017-07-25 DIAGNOSIS — Z79899 Other long term (current) drug therapy: Secondary | ICD-10-CM | POA: Insufficient documentation

## 2017-07-25 DIAGNOSIS — N3090 Cystitis, unspecified without hematuria: Secondary | ICD-10-CM | POA: Insufficient documentation

## 2017-07-25 DIAGNOSIS — E119 Type 2 diabetes mellitus without complications: Secondary | ICD-10-CM | POA: Insufficient documentation

## 2017-07-25 DIAGNOSIS — R112 Nausea with vomiting, unspecified: Secondary | ICD-10-CM | POA: Insufficient documentation

## 2017-07-25 DIAGNOSIS — R1011 Right upper quadrant pain: Secondary | ICD-10-CM

## 2017-07-25 LAB — COMPREHENSIVE METABOLIC PANEL
ALK PHOS: 77 U/L (ref 38–126)
ALT: 23 U/L (ref 14–54)
ANION GAP: 8 (ref 5–15)
AST: 21 U/L (ref 15–41)
Albumin: 3.5 g/dL (ref 3.5–5.0)
BILIRUBIN TOTAL: 0.7 mg/dL (ref 0.3–1.2)
BUN: 15 mg/dL (ref 6–20)
CALCIUM: 8.5 mg/dL — AB (ref 8.9–10.3)
CO2: 26 mmol/L (ref 22–32)
CREATININE: 0.6 mg/dL (ref 0.44–1.00)
Chloride: 99 mmol/L — ABNORMAL LOW (ref 101–111)
GFR calc Af Amer: >60 mL/min (ref >60)
Glucose, Bld: 471 mg/dL — ABNORMAL HIGH (ref 65–99)
Potassium: 3.8 mmol/L (ref 3.5–5.1)
SODIUM: 133 mmol/L — AB (ref 135–145)
TOTAL PROTEIN: 7.4 g/dL (ref 6.5–8.1)

## 2017-07-25 LAB — URINALYSIS, COMPLETE (UACMP) WITH MICROSCOPIC
Bilirubin Urine: NEGATIVE
HGB URINE DIPSTICK: NEGATIVE
KETONES UR: NEGATIVE mg/dL
Leukocytes, UA: NEGATIVE
NITRITE: POSITIVE — AB
PH: 6 (ref 5.0–8.0)
Protein, ur: NEGATIVE mg/dL
Specific Gravity, Urine: 1.033 — ABNORMAL HIGH (ref 1.005–1.030)

## 2017-07-25 LAB — CBC
HCT: 38.2 % (ref 35.0–47.0)
Hemoglobin: 12.7 g/dL (ref 12.0–16.0)
MCH: 29.7 pg (ref 26.0–34.0)
MCHC: 33.2 g/dL (ref 32.0–36.0)
MCV: 89.4 fL (ref 80.0–100.0)
PLATELETS: 237 10*3/uL (ref 150–440)
RBC: 4.27 MIL/uL (ref 3.80–5.20)
RDW: 13.2 % (ref 11.5–14.5)
WBC: 9.8 10*3/uL (ref 3.6–11.0)

## 2017-07-25 LAB — TROPONIN I: Troponin I: <0.03 ng/mL (ref <0.03)

## 2017-07-25 LAB — POCT PREGNANCY, URINE: Preg Test, Ur: NEGATIVE

## 2017-07-25 LAB — LIPASE, BLOOD: Lipase: 30 U/L (ref 11–51)

## 2017-07-25 MED ORDER — PROMETHAZINE HCL 25 MG/ML IJ SOLN: 12.5000 mg | Freq: Four times a day (QID) | INTRAMUSCULAR | Status: DC | PRN

## 2017-07-25 MED ORDER — DICYCLOMINE HCL 10 MG PO CAPS: 10.0000 mg | ORAL_CAPSULE | Freq: Three times a day (TID) | ORAL | 0 refills | Status: DC | PRN

## 2017-07-25 MED ORDER — SODIUM CHLORIDE 0.9 % IV BOLUS (SEPSIS)
1000.0000 mL | Freq: Once | INTRAVENOUS | Status: AC
  Administered 2017-07-25: 1000 mL via INTRAVENOUS

## 2017-07-25 MED ORDER — CEPHALEXIN 500 MG PO CAPS: 500.0000 mg | ORAL_CAPSULE | Freq: Three times a day (TID) | ORAL | 0 refills | Status: AC

## 2017-07-25 MED ORDER — CEPHALEXIN 500 MG PO CAPS
ORAL_CAPSULE | ORAL | Status: AC
  Administered 2017-07-25: 500 mg via ORAL
  Filled 2017-07-25: qty 1

## 2017-07-25 MED ORDER — CEPHALEXIN 500 MG PO CAPS
500.0000 mg | ORAL_CAPSULE | Freq: Once | ORAL | Status: AC
Start: 2017-07-25 — End: 2017-07-25
  Administered 2017-07-25: 500 mg via ORAL

## 2017-07-25 MED ORDER — ONDANSETRON 4 MG PO TBDP
4.0000 mg | ORAL_TABLET | Freq: Once | ORAL | Status: AC | PRN
  Administered 2017-07-25: 4 mg via ORAL
  Filled 2017-07-25: qty 1

## 2017-07-25 MED ORDER — KETOROLAC TROMETHAMINE 30 MG/ML IJ SOLN
15.0000 mg | Freq: Once | INTRAMUSCULAR | Status: AC
  Administered 2017-07-25: 15 mg via INTRAVENOUS
  Filled 2017-07-25: qty 1

## 2017-07-25 MED ORDER — DICYCLOMINE HCL 10 MG PO CAPS
10.0000 mg | ORAL_CAPSULE | Freq: Once | ORAL | Status: AC
  Administered 2017-07-25: 10 mg via ORAL
  Filled 2017-07-25: qty 1

## 2017-07-25 MED ORDER — PROMETHAZINE HCL 12.5 MG PO TABS: 12.5000 mg | ORAL_TABLET | Freq: Four times a day (QID) | ORAL | 0 refills | Status: DC | PRN

## 2017-07-25 NOTE — ED Triage Notes (Signed)
Patient c/o upper abdominal pain and emeses beginning today. Patient reports 3 emeses today. Patient denies diarrhea.

## 2017-07-25 NOTE — ED Notes (Signed)
Pt up to toilet 

## 2017-07-25 NOTE — ED Provider Notes (Signed)
[facility] Emergency Department Provider Note    None    (approximate)  I have reviewed the triage vital signs and the nursing notes.   HISTORY  Chief Complaint Abdominal Pain and Emesis    HPI Carolyn Andrews is a 27 y.o. female history of diabetes presents with chief complaint of severe epigastric pain and right upper quadrant pain associated with nausea and vomiting.  Patient unable to keep anything down.  Denies any chest pain or shortness of breath.  No measured fevers.  Denies any dysuria hematuria diarrhea or change in stool color.  Has never had pain like this before.  Is not tried anything to help with the pain.  States the pain is mild to moderate and is not been able to keep anything down.  Past Medical History:  Diagnosis Date  . Diabetes (HCC)    Family History  Problem Relation Age of Onset  . Diabetes Mother    History reviewed. No pertinent surgical history. There are no active problems to display for this patient.     Prior to Admission medications   Medication Sig Start Date End Date Taking? Authorizing Provider  cephALEXin (KEFLEX) 500 MG capsule Take 1 capsule (500 mg total) by mouth 4 (four) times daily. 11/29/15   Joni Reining, PA-C  FLUoxetine (PROZAC) 20 MG capsule Take 20 mg by mouth daily.    [provider]  glipiZIDE (GLUCOTROL) 10 MG tablet Take 10 mg by mouth daily before breakfast.    [provider]  ibuprofen (ADVIL,MOTRIN) 600 MG tablet Take 1 tablet (600 mg total) by mouth every 8 (eight) hours as needed. 11/29/15   Joni Reining, PA-C  metFORMIN (GLUCOPHAGE) 1000 MG tablet Take 1 tablet (1,000 mg total) by mouth 2 (two) times daily with a meal. 10/30/14 10/30/15  Rebecka Apley, MD  neomycin-polymyxin-pramoxine (NEOSPORIN PLUS) 1 % cream Apply topically 2 (two) times daily. 11/29/15   Joni Reining, PA-C  ondansetron (ZOFRAN ODT) 8 MG disintegrating tablet Take 1 tablet (8 mg total) by  mouth every 8 (eight) hours as needed for nausea or vomiting. 12/28/14   Sharman Cheek, MD  predniSONE (DELTASONE) 50 MG tablet Take one 50 mg tablet once a day for 5 days. 07/13/17   Orvil Feil, PA-C    Allergies Patient has no known allergies.    Social History Social History   Tobacco Use  . Smoking status: Never Smoker  . Smokeless tobacco: Never Used  Substance Use Topics  . Alcohol use: No  . Drug use: Not on file    Review of Systems Patient denies headaches, rhinorrhea, blurry vision, numbness, shortness of breath, chest pain, edema, cough, abdominal pain, nausea, vomiting, diarrhea, dysuria, fevers, rashes or hallucinations unless otherwise stated above in HPI. ____________________________________________   PHYSICAL EXAM:  VITAL SIGNS: Vitals:   07/25/17 1950  BP: 140/63  Pulse: (!) 102  Resp: 17  Temp: 97.6 F (36.4 C)    Constitutional: Alert and oriented. Obese but well appearing and in no acute distress. Eyes: Conjunctivae are normal.  Head: Atraumatic. Nose: No congestion/rhinnorhea. Mouth/Throat: Mucous membranes are moist.   Neck: No stridor. Painless ROM.  Cardiovascular: Normal rate, regular rhythm. Grossly normal heart sounds.  Good peripheral circulation. Respiratory: Normal respiratory effort.  No retractions. Lungs CTAB. Gastrointestinal: Soft and with mild ruq ttp, no guarding or peritonitis. No distention. No abdominal bruits. No CVA tenderness. Genitourinary:  Musculoskeletal: No lower extremity tenderness nor edema.  No joint effusions. Neurologic:  Normal speech and language. No gross focal neurologic deficits are appreciated. No facial droop Skin:  Skin is warm, dry and intact. No rash noted. Psychiatric: Mood and affect are normal. Speech and behavior are normal.  ____________________________________________   LABS (all labs ordered are listed, but only abnormal results are displayed)  Results for orders placed or performed  during the hospital encounter of 07/25/17 (from the past 24 hour(s))  Lipase, blood     Status: None   Collection Time: 07/25/17  7:48 PM  Result Value Ref Range   Lipase 30 11 - 51 U/L  Comprehensive metabolic panel     Status: Abnormal   Collection Time: 07/25/17  7:48 PM  Result Value Ref Range   Sodium 133 (L) 135 - 145 mmol/L   Potassium 3.8 3.5 - 5.1 mmol/L   Chloride 99 (L) 101 - 111 mmol/L   CO2 26 22 - 32 mmol/L   Glucose, Bld 471 (H) 65 - 99 mg/dL   BUN 15 6 - 20 mg/dL   Creatinine, Ser 1.610.60 0.44 - 1.00 mg/dL   Calcium 8.5 (L) 8.9 - 10.3 mg/dL   Total Protein 7.4 6.5 - 8.1 g/dL   Albumin 3.5 3.5 - 5.0 g/dL   AST 21 15 - 41 U/L   ALT 23 14 - 54 U/L   Alkaline Phosphatase 77 38 - 126 U/L   Total Bilirubin 0.7 0.3 - 1.2 mg/dL   GFR calc non Af Amer >60 >60 mL/min   GFR calc Af Amer >60 >60 mL/min   Anion gap 8 5 - 15  CBC     Status: None   Collection Time: 07/25/17  7:48 PM  Result Value Ref Range   WBC 9.8 3.6 - 11.0 K/uL   RBC 4.27 3.80 - 5.20 MIL/uL   Hemoglobin 12.7 12.0 - 16.0 g/dL   HCT 09.638.2 04.535.0 - 40.947.0 %   MCV 89.4 80.0 - 100.0 fL   MCH 29.7 26.0 - 34.0 pg   MCHC 33.2 32.0 - 36.0 g/dL   RDW 81.113.2 91.411.5 - 78.214.5 %   Platelets 237 150 - 440 K/uL  Urinalysis, Complete w Microscopic     Status: Abnormal   Collection Time: 07/25/17  7:48 PM  Result Value Ref Range   Color, Urine YELLOW (A) YELLOW   APPearance HAZY (A) CLEAR   Specific Gravity, Urine 1.033 (H) 1.005 - 1.030   pH 6.0 5.0 - 8.0   Glucose, UA >=500 (A) NEGATIVE mg/dL   Hgb urine dipstick NEGATIVE NEGATIVE   Bilirubin Urine NEGATIVE NEGATIVE   Ketones, ur NEGATIVE NEGATIVE mg/dL   Protein, ur NEGATIVE NEGATIVE mg/dL   Nitrite POSITIVE (A) NEGATIVE   Leukocytes, UA NEGATIVE NEGATIVE   RBC / HPF 6-30 0 - 5 RBC/hpf   WBC, UA 6-30 0 - 5 WBC/hpf   Bacteria, UA FEW (A) NONE SEEN   Squamous Epithelial / LPF 6-30 (A) NONE SEEN   Budding Yeast PRESENT   Troponin I     Status: None   Collection Time:  07/25/17  7:48 PM  Result Value Ref Range   Troponin I <0.03 <0.03 ng/mL  Pregnancy, urine POC     Status: None   Collection Time: 07/25/17  8:23 PM  Result Value Ref Range   Preg Test, Ur NEGATIVE NEGATIVE   ____________________________________________  EKG My review and personal interpretation at Time: 19:53   Indication: epigastric pain  Rate: 95  Rhythm: sinus Axis: normal  Other: normal intervals, no stemi ____________________________________________  RADIOLOGY  I personally reviewed all radiographic images ordered to evaluate for the above acute complaints and reviewed radiology reports and findings.  These findings were personally discussed with the patient.  Please see medical record for radiology report.  ____________________________________________   PROCEDURES  Procedure(s) performed:  Procedures    Critical Care performed: no ____________________________________________   INITIAL IMPRESSION / ASSESSMENT AND PLAN / ED COURSE  Pertinent labs & imaging results that were available during my care of the patient were reviewed by me and considered in my medical decision making (see chart for details).  DDX: Cholecystitis, cholangitis, cholelithiasis, hepatitis, gastritis, SBO, UTI, enteritis  Carolyn Andrews is a 27 y.o. who presents to the ED with symptoms as described above.  Patient afebrile hematin amply stable.  Blood work is reassuring.  Urinalysis does show evidence of nitrite positive probable UTI but patient denies any symptoms of urinary tract infection.  Based on her obesity and epigastric pain radiating to the right quadrant will order ultrasound.  Ultrasound showed no evidence of acute cholecystitis or cholelithiasis.  Discussed results of ultrasound with patient.  Repeat abdominal exam soft and benign.  Patient is tolerating oral hydration after receiving IV fluids as well as Zofran.  Likely has some component of biliary colic.  At this point do  believe patient is stable and appropriate for outpatient referral.  Will be started on antibiotics for UTI.  Discussed signs and symptoms for which she should return to the ER.  A      ____________________________________________   FINAL CLINICAL IMPRESSION(S) / ED DIAGNOSES  Final diagnoses:  RUQ pain  Nausea and vomiting, intractability of vomiting not specified, unspecified vomiting type  Cystitis      NEW MEDICATIONS STARTED DURING THIS VISIT:  New Prescriptions   No medications on file     Note:  This document was prepared using Dragon voice recognition software and may include unintentional dictation errors.    Willy Eddy, MD 07/25/17 [phone]

## 2017-07-25 NOTE — Discharge Instructions (Signed)

## 2018-12-08 ENCOUNTER — Other Ambulatory Visit: Payer: Self-pay

## 2018-12-08 ENCOUNTER — Emergency Department
Admission: EM | Admit: 2018-12-08 | Discharge: 2018-12-08 | Disposition: A | Discharge status: Home / Self Care | Payer: Self-pay | Attending: Emergency Medicine | Admitting: Emergency Medicine

## 2018-12-08 ENCOUNTER — Encounter: Payer: Self-pay | Admitting: Emergency Medicine

## 2018-12-08 DIAGNOSIS — E119 Type 2 diabetes mellitus without complications: Secondary | ICD-10-CM | POA: Insufficient documentation

## 2018-12-08 DIAGNOSIS — Z7984 Long term (current) use of oral hypoglycemic drugs: Secondary | ICD-10-CM | POA: Insufficient documentation

## 2018-12-08 DIAGNOSIS — Z79899 Other long term (current) drug therapy: Secondary | ICD-10-CM | POA: Insufficient documentation

## 2018-12-08 DIAGNOSIS — N764 Abscess of vulva: Secondary | ICD-10-CM | POA: Insufficient documentation

## 2018-12-08 MED ORDER — SULFAMETHOXAZOLE-TRIMETHOPRIM 800-160 MG PO TABS
1.0000 | ORAL_TABLET | Freq: Once | ORAL | Status: AC
  Administered 2018-12-08: 1 via ORAL
  Filled 2018-12-08: qty 1

## 2018-12-08 MED ORDER — SULFAMETHOXAZOLE-TRIMETHOPRIM 800-160 MG PO TABS: 1.0000 | ORAL_TABLET | Freq: Two times a day (BID) | ORAL | 0 refills | Status: DC

## 2018-12-08 MED ORDER — LIDOCAINE HCL (PF) 1 % IJ SOLN
5.0000 mL | Freq: Once | INTRAMUSCULAR | Status: AC
  Administered 2018-12-08: 5 mL via INTRADERMAL
  Filled 2018-12-08: qty 5

## 2018-12-08 NOTE — ED Provider Notes (Signed)
[facility] Emergency Department Provider Note  ____________________________________________  Time seen: Approximately 2:46 PM  I have reviewed the triage vital signs and the nursing notes.   HISTORY  Chief Complaint Abscess    HPI Carolyn Andrews is a 28 y.o. female that presents to the emergency department for evaluation of labial abscess for 2 days.  Patient has never had an abscess before.  No fevers.   Past Medical History:  Diagnosis Date  . Diabetes (HCC)     There are no active problems to display for this patient.   History reviewed. No pertinent surgical history.  Prior to Admission medications   Medication Sig Start Date End Date Taking? Authorizing Provider  FLUoxetine (PROZAC) 20 MG capsule Take 20 mg by mouth daily.    [provider]  glipiZIDE (GLUCOTROL) 10 MG tablet Take 10 mg by mouth daily before breakfast.    [provider]  metFORMIN (GLUCOPHAGE) 1000 MG tablet Take 1 tablet (1,000 mg total) by mouth 2 (two) times daily with a meal. 10/30/14 10/30/15  Rebecka ApleyWebster, Allison P, MD  neomycin-polymyxin-pramoxine (NEOSPORIN PLUS) 1 % cream Apply topically 2 (two) times daily. 11/29/15   Joni ReiningSmith, Ronald K, PA-C  sulfamethoxazole-trimethoprim (BACTRIM DS) 800-160 MG tablet Take 1 tablet by mouth 2 (two) times daily. 12/08/18   Enid DerryWagner, Kenniyah Sasaki, PA-C    Allergies Patient has no known allergies.  Family History  Problem Relation Age of Onset  . Diabetes Mother     Social History Social History   Tobacco Use  . Smoking status: Never Smoker  . Smokeless tobacco: Never Used  Substance Use Topics  . Alcohol use: No  . Drug use: Not on file     Review of Systems  Constitutional: No fever/chills Respiratory: No SOB. Gastrointestinal: No abdominal pain.  No nausea, no vomiting.  Musculoskeletal: Negative for musculoskeletal pain. Skin: Negative for rash, abrasions, lacerations,  ecchymosis.   ____________________________________________   PHYSICAL EXAM:  VITAL SIGNS: ED Triage Vitals  Enc Vitals Group     BP 12/08/18 1413 137/78     Pulse Rate 12/08/18 1413 85     Resp 12/08/18 1413 20     Temp 12/08/18 1413 98.4 F (36.9 C)     Temp Source 12/08/18 1413 Oral     SpO2 12/08/18 1413 99 %     Weight 12/08/18 1409 193 lb (87.5 kg)     Height 12/08/18 1409 5' (1.524 m)     Head Circumference --      Peak Flow --      Pain Score 12/08/18 1409 10     Pain Loc --      Pain Edu? --      Excl. in GC? --      Constitutional: Alert and oriented. Well appearing and in no acute distress. Eyes: Conjunctivae are normal. PERRL. EOMI. Head: Atraumatic. ENT:      Ears:      Nose: No congestion/rhinnorhea.      Mouth/Throat: Mucous membranes are moist.  Neck: No stridor. Cardiovascular: Normal rate, regular rhythm.  Good peripheral circulation. Respiratory: Normal respiratory effort without tachypnea or retractions. Lungs CTAB. Good air entry to the bases with no decreased or absent breath sounds. Genitourinary: 2 cm x 1 cm abscess to right labia. Musculoskeletal: Full range of motion to all extremities. No gross deformities appreciated. Neurologic:  Normal speech and language. No gross focal neurologic deficits are appreciated.  Skin:  Skin is warm, dry and intact. No  rash noted. Psychiatric: Mood and affect are normal. Speech and behavior are normal. Patient exhibits appropriate insight and judgement.   ____________________________________________   LABS (all labs ordered are listed, but only abnormal results are displayed)  Labs Reviewed - No data to display ____________________________________________  EKG   ____________________________________________  RADIOLOGY   No results found.  ____________________________________________    PROCEDURES  Procedure(s) performed:    Procedures  INCISION AND DRAINAGE Performed by: Laban Emperor Consent: Verbal consent obtained. Risks and benefits: risks, benefits and alternatives were discussed Type: abscess  Body area: labia  Anesthesia: local infiltration  Incision was made with a scalpel.  Local anesthetic: lidocaine 1 % without epinephrine  Anesthetic total: 3 ml  Complexity: complex Blunt dissection to break up loculations  Drainage: purulent  Drainage amount: moderate  Packing material: 1/4 in iodoform gauze  Patient tolerance: Patient tolerated the procedure well with no immediate complications.    Medications  lidocaine (PF) (XYLOCAINE) 1 % injection 5 mL (5 mLs Intradermal Given 12/08/18 1549)  sulfamethoxazole-trimethoprim (BACTRIM DS) 800-160 MG per tablet 1 tablet (1 tablet Oral Given 12/08/18 1549)     ____________________________________________   INITIAL IMPRESSION / ASSESSMENT AND PLAN / ED COURSE  Pertinent labs & imaging results that were available during my care of the patient were reviewed by me and considered in my medical decision making (see chart for details).  Review of the [place] CSRS was performed in accordance of the [place] prior to dispensing any controlled drugs.   Patient's diagnosis is consistent with labial abscess.  Abscess was drained and packed in the emergency department.  Patient will be discharged home with prescriptions for Bactrim. Patient is to follow up with primary care and emergency department as directed. Patient is given ED precautions to return to the ED for any worsening or new symptoms.  Carolyn Andrews was evaluated in Emergency Department on 12/08/2018 for the symptoms described in the history of present illness. She was evaluated in the context of the global COVID-19 pandemic, which necessitated consideration that the patient might be at risk for infection with the SARS-CoV-2 virus that causes COVID-19. Institutional protocols and algorithms that pertain to the evaluation of patients at risk for  COVID-19 are in a state of rapid change based on information released by regulatory bodies including the CDC and federal and state organizations. These policies and algorithms were followed during the patient's care in the ED.   ____________________________________________  FINAL CLINICAL IMPRESSION(S) / ED DIAGNOSES  Final diagnoses:  Labial abscess      NEW MEDICATIONS STARTED DURING THIS VISIT:  ED Discharge Orders         Ordered    sulfamethoxazole-trimethoprim (BACTRIM DS) 800-160 MG tablet  2 times daily     12/08/18 1528              This chart was dictated using voice recognition software/Dragon. Despite best efforts to proofread, errors can occur which can change the meaning. Any change was purely unintentional.    Laban Emperor, PA-C 12/08/18 1730    Lavonia Drafts, MD 12/11/18 [phone]

## 2018-12-08 NOTE — ED Triage Notes (Signed)
Pt states abscess to vaginal area for 2 days and hurts to walk and sit

## 2018-12-08 NOTE — ED Notes (Signed)
See triage note  Presents with possible abscess to vaginal area  Noted pain about 2 days ago

## 2018-12-10 ENCOUNTER — Emergency Department
Admission: EM | Admit: 2018-12-10 | Discharge: 2018-12-10 | Disposition: A | Discharge status: Home / Self Care | Payer: Self-pay | Attending: Emergency Medicine | Admitting: Emergency Medicine

## 2018-12-10 ENCOUNTER — Other Ambulatory Visit: Payer: Self-pay

## 2018-12-10 ENCOUNTER — Encounter: Payer: Self-pay | Admitting: Emergency Medicine

## 2018-12-10 DIAGNOSIS — Z7984 Long term (current) use of oral hypoglycemic drugs: Secondary | ICD-10-CM | POA: Insufficient documentation

## 2018-12-10 DIAGNOSIS — Z79899 Other long term (current) drug therapy: Secondary | ICD-10-CM | POA: Insufficient documentation

## 2018-12-10 DIAGNOSIS — Z5189 Encounter for other specified aftercare: Secondary | ICD-10-CM

## 2018-12-10 DIAGNOSIS — Z4800 Encounter for change or removal of nonsurgical wound dressing: Secondary | ICD-10-CM | POA: Insufficient documentation

## 2018-12-10 DIAGNOSIS — E119 Type 2 diabetes mellitus without complications: Secondary | ICD-10-CM | POA: Insufficient documentation

## 2018-12-10 NOTE — ED Triage Notes (Signed)
Pt states had abscess lanced on Friday, instructed to return today to remove packing and for wound check.

## 2018-12-10 NOTE — Discharge Instructions (Addendum)
Follow-up with your primary care doctor or Dr.Pabon who is the surgeon on call today if you continue to have problems with this.  Begin using warm moist compresses to the area frequently.  You may continue to see some drainage from the area.  Finish your antibiotics until completely finished.  Return to the emergency department if any severe worsening of your symptoms, elevated fever, nausea or vomiting.

## 2018-12-10 NOTE — ED Provider Notes (Signed)
[facility] Emergency Department Provider Note  ____________________________________________   First MD Initiated Contact with Patient 12/10/18 1300     (approximate)  I have reviewed the triage vital signs and the nursing notes.   HISTORY  Chief Complaint Wound Check   HPI [name] is a 28 y.o. female presents to the ED for a recheck of an area that was I&D 2 days ago.  Patient understands that the packing is to be removed today.  She is not having any difficulty taking the antibiotic.     Past Medical History:  Diagnosis Date  . Diabetes (HCC)     There are no active problems to display for this patient.   History reviewed. No pertinent surgical history.  Prior to Admission medications   Medication Sig Start Date End Date Taking? Authorizing Provider  FLUoxetine (PROZAC) 20 MG capsule Take 20 mg by mouth daily.    [provider]  glipiZIDE (GLUCOTROL) 10 MG tablet Take 10 mg by mouth daily before breakfast.    [provider]  metFORMIN (GLUCOPHAGE) 1000 MG tablet Take 1 tablet (1,000 mg total) by mouth 2 (two) times daily with a meal. 10/30/14 10/30/15  Rebecka ApleyWebster, Allison P, MD  neomycin-polymyxin-pramoxine (NEOSPORIN PLUS) 1 % cream Apply topically 2 (two) times daily. 11/29/15   Joni ReiningSmith, Ronald K, PA-C  sulfamethoxazole-trimethoprim (BACTRIM DS) 800-160 MG tablet Take 1 tablet by mouth 2 (two) times daily. 12/08/18   Enid DerryWagner, Ashley, PA-C    Allergies Patient has no known allergies.  Family History  Problem Relation Age of Onset  . Diabetes Mother     Social History Social History   Tobacco Use  . Smoking status: Never Smoker  . Smokeless tobacco: Never Used  Substance Use Topics  . Alcohol use: No  . Drug use: Not on file    Review of Systems Constitutional: No fever/chills Cardiovascular: Denies chest pain. Respiratory: Denies shortness of breath. Gastrointestinal: No abdominal pain.  No nausea, no  vomiting.  Musculoskeletal: Negative for muscle aches Skin: Resolving abscess Neurological: Negative for headaches, focal weakness or numbness.  ____________________________________________   PHYSICAL EXAM:  VITAL SIGNS: ED Triage Vitals [12/10/18 1247]  Enc Vitals Group     BP 117/66     Pulse Rate 83     Resp 18     Temp 98.6 F (37 C)     Temp Source Oral     SpO2 97 %     Weight 185 lb (83.9 kg)     Height 5' (1.524 m)     Head Circumference      Peak Flow      Pain Score 9     Pain Loc      Pain Edu?      Excl. in GC?     Constitutional: Alert and oriented. Well appearing and in no acute distress. Eyes: Conjunctivae are normal.  Head: Atraumatic. Neck: No stridor.   Cardiovascular: Normal rate, regular rhythm. Grossly normal heart sounds.  Good peripheral circulation. Respiratory: Normal respiratory effort.  No retractions. Lungs CTAB. Musculoskeletal: Moves upper and lower extremities without difficulty.  Normal gait was noted. Neurologic:  Normal speech and language. No gross focal neurologic deficits are appreciated. No gait instability. Skin:  Skin is warm, dry.  Right labia with drain still in place.  This was removed without any difficulties.  Minimal drainage was present. Psychiatric: Mood and affect are normal. Speech and behavior are normal.  ____________________________________________   LABS (  all labs ordered are listed, but only abnormal results are displayed)  Labs Reviewed - No data to display  PROCEDURES  Procedure(s) performed (including Critical Care):  Procedures Packing was removed from the abscess site by provider.  ____________________________________________   INITIAL IMPRESSION / ASSESSMENT AND PLAN / ED COURSE  As part of my medical decision making, I reviewed the following data within the electronic MEDICAL RECORD NUMBER Notes from prior ED visits and [place] Controlled Substance Database  28 year old female presents to the ED for  packing removal of an I&D that she had done 2 days ago.  She continues to take the antibiotics without any difficulty.  Iodoform gauze was removed without any difficulty.  Patient is encouraged to use warm compresses to the area frequently.  She is to return to the ED if any severe worsening of her symptoms and she was also given the name of a surgeon if this continues to give her problems.  ____________________________________________   FINAL CLINICAL IMPRESSION(S) / ED DIAGNOSES  Final diagnoses:  Encounter for wound re-check     ED Discharge Orders    None       Note:  This document was prepared using Dragon voice recognition software and may include unintentional dictation errors.    Johnn Hai, PA-C 12/10/18 1558    Carrie Mew, MD 12/13/18 [phone]

## 2018-12-14 ENCOUNTER — Other Ambulatory Visit: Payer: Self-pay

## 2018-12-14 ENCOUNTER — Emergency Department
Admission: EM | Admit: 2018-12-14 | Discharge: 2018-12-14 | Disposition: A | Discharge status: Home / Self Care | Payer: Self-pay | Attending: Emergency Medicine | Admitting: Emergency Medicine

## 2018-12-14 ENCOUNTER — Encounter: Payer: Self-pay | Admitting: Emergency Medicine

## 2018-12-14 DIAGNOSIS — S31114D Laceration without foreign body of abdominal wall, left lower quadrant without penetration into peritoneal cavity, subsequent encounter: Secondary | ICD-10-CM | POA: Insufficient documentation

## 2018-12-14 DIAGNOSIS — E119 Type 2 diabetes mellitus without complications: Secondary | ICD-10-CM | POA: Insufficient documentation

## 2018-12-14 DIAGNOSIS — X58XXXA Exposure to other specified factors, initial encounter: Secondary | ICD-10-CM | POA: Insufficient documentation

## 2018-12-14 DIAGNOSIS — Z5189 Encounter for other specified aftercare: Secondary | ICD-10-CM

## 2018-12-14 NOTE — ED Provider Notes (Signed)
[facility] Emergency Department Provider Note  ____________________________________________  Time seen: Approximately 4:58 PM  I have reviewed the triage vital signs and the nursing notes.   HISTORY  Chief Complaint Wound Check    HPI Carolyn Andrews is a 28 y.o. female that presents to the emergency department for recheck of abscess that was I&Dd 2 days ago.  Patient states today that she had some blood on her shorts from the abscess and thought she should come in and get it checked.  Patient states that pain is significantly better than it was 2 days ago.  2 days ago, she was not not able to sit and now she can relatively comfortably.  She had another abscess that was drained by me 6 days ago that feels significantly better now and does not have any pain or drainage.  She has 3 days left of her Bactrim.  She has an appoint with gynecology tomorrow.  No fevers..   Past Medical History:  Diagnosis Date  . Diabetes ([place])     There are no active problems to display for this patient.   History reviewed. No pertinent surgical history.  Prior to Admission medications   Medication Sig Start Date End Date Taking? Authorizing Provider  FLUoxetine (PROZAC) 20 MG capsule Take 20 mg by mouth daily.    [provider]  glipiZIDE (GLUCOTROL) 10 MG tablet Take 10 mg by mouth daily before breakfast.    [provider]  metFORMIN (GLUCOPHAGE) 1000 MG tablet Take 1 tablet (1,000 mg total) by mouth 2 (two) times daily with a meal. 10/30/14 10/30/15  Loney Hering, MD  neomycin-polymyxin-pramoxine (NEOSPORIN PLUS) 1 % cream Apply topically 2 (two) times daily. 11/29/15   Sable Feil, PA-C  sulfamethoxazole-trimethoprim (BACTRIM DS) 800-160 MG tablet Take 1 tablet by mouth 2 (two) times daily. 12/08/18   Laban Emperor, PA-C    Allergies Patient has no known allergies.  Family History  Problem Relation Age of Onset  . Diabetes Mother      Social History Social History   Tobacco Use  . Smoking status: Never Smoker  . Smokeless tobacco: Never Used  Substance Use Topics  . Alcohol use: No  . Drug use: Not on file     Review of Systems  Constitutional: No fever/chills Gastrointestinal: No nausea, no vomiting.  Musculoskeletal: Negative for musculoskeletal pain. Skin: Negative for rash, abrasions, lacerations, ecchymosis.   ____________________________________________   PHYSICAL EXAM:  VITAL SIGNS: ED Triage Vitals  Enc Vitals Group     BP 12/14/18 1631 124/71     Pulse Rate 12/14/18 1631 88     Resp 12/14/18 1631 18     Temp 12/14/18 1631 98 F (36.7 C)     Temp Source 12/14/18 1631 Oral     SpO2 12/14/18 1631 99 %     Weight 12/14/18 1630 195 lb (88.5 kg)     Height 12/14/18 1630 5' (1.524 m)     Head Circumference --      Peak Flow --      Pain Score 12/14/18 1630 5     Pain Loc --      Pain Edu? --      Excl. in [place]? --      Constitutional: Alert and oriented. Well appearing and in no acute distress. Eyes: Conjunctivae are normal. PERRL. EOMI. Head: Atraumatic. ENT:      Ears:      Nose: No congestion/rhinnorhea.  Mouth/Throat: Mucous membranes are moist.  Neck: No stridor.  Cardiovascular: Normal rate, regular rhythm.  Good peripheral circulation. Respiratory: Normal respiratory effort without tachypnea or retractions. Lungs CTAB. Good air entry to the bases with no decreased or absent breath sounds. Musculoskeletal: Full range of motion to all extremities. No gross deformities appreciated. Neurologic:  Normal speech and language. No gross focal neurologic deficits are appreciated.  Skin:  Skin is warm, dry.  Small laceration with packing in place to left groin with 1 cm surrounding ecchymosis and induration.  Tenderness to palpation over ecchymosis but no tenderness to palpation surrounding ecchymosis.  No current drainage.  Small laceration to left labia without any overlying  erythema, ecchymosis, tenderness to palpation.  No drainage. Psychiatric: Mood and affect are normal. Speech and behavior are normal. Patient exhibits appropriate insight and judgement.   ____________________________________________   LABS (all labs ordered are listed, but only abnormal results are displayed)  Labs Reviewed - No data to display ____________________________________________  EKG   ____________________________________________  RADIOLOGY   No results found.  ____________________________________________    PROCEDURES  Procedure(s) performed:    Procedures    Medications - No data to display   ____________________________________________   INITIAL IMPRESSION / ASSESSMENT AND PLAN / ED COURSE  Pertinent labs & imaging results that were available during my care of the patient were reviewed by me and considered in my medical decision making (see chart for details).  Review of the [place] CSRS was performed in accordance of the [facility] prior to dispensing any controlled drugs.    Patient presents emergency department for recheck of abscess that was drained 2 days ago.  Vital signs and exam are reassuring.  Abscess appears to be healing well.  Patient will continue antibiotics.  She has an appointment tomorrow with gynecology for wound recheck and packing removal. Patient is to follow up with gynecology as directed. Patient is given ED precautions to return to the ED for any worsening or new symptoms.  Carolyn Andrews was evaluated in Emergency Department on 12/14/2018 for the symptoms described in the history of present illness. She was evaluated in the context of the global COVID-19 pandemic, which necessitated consideration that the patient might be at risk for infection with the SARS-CoV-2 virus that causes COVID-19. Institutional protocols and algorithms that pertain to the evaluation of patients at risk for COVID-19 are in a state of rapid change based on  information released by regulatory bodies including the CDC and federal and state organizations. These policies and algorithms were followed during the patient's care in the ED.   ____________________________________________  FINAL CLINICAL IMPRESSION(S) / ED DIAGNOSES  Final diagnoses:  Wound check, abscess      NEW MEDICATIONS STARTED DURING THIS VISIT:  ED Discharge Orders    None          This chart was dictated using voice recognition software/Dragon. Despite best efforts to proofread, errors can occur which can change the meaning. Any change was purely unintentional.    Enid DerryWagner, Keilynn Marano, PA-C 12/14/18 2057    Minna AntisPaduchowski, Kevin, MD 12/14/18 2253

## 2018-12-14 NOTE — ED Triage Notes (Signed)
Presents with bleeding from groin area  States she had abscess lanced 2 days ago at [facility]  Now having bleeding

## 2018-12-15 ENCOUNTER — Encounter: Payer: Self-pay | Admitting: *Deleted

## 2020-02-08 ENCOUNTER — Emergency Department: Payer: Self-pay

## 2020-02-08 ENCOUNTER — Emergency Department
Admission: EM | Admit: 2020-02-08 | Discharge: 2020-02-08 | Disposition: A | Discharge status: Home / Self Care | Payer: Self-pay | Attending: Emergency Medicine | Admitting: Emergency Medicine

## 2020-02-08 ENCOUNTER — Encounter: Payer: Self-pay | Admitting: Emergency Medicine

## 2020-02-08 ENCOUNTER — Other Ambulatory Visit: Payer: Self-pay

## 2020-02-08 DIAGNOSIS — R739 Hyperglycemia, unspecified: Secondary | ICD-10-CM

## 2020-02-08 DIAGNOSIS — N3001 Acute cystitis with hematuria: Secondary | ICD-10-CM | POA: Insufficient documentation

## 2020-02-08 DIAGNOSIS — Z7984 Long term (current) use of oral hypoglycemic drugs: Secondary | ICD-10-CM | POA: Insufficient documentation

## 2020-02-08 DIAGNOSIS — K5900 Constipation, unspecified: Secondary | ICD-10-CM | POA: Insufficient documentation

## 2020-02-08 DIAGNOSIS — E1165 Type 2 diabetes mellitus with hyperglycemia: Secondary | ICD-10-CM | POA: Insufficient documentation

## 2020-02-08 LAB — URINALYSIS, COMPLETE (UACMP) WITH MICROSCOPIC
Bilirubin Urine: NEGATIVE
Glucose, UA: >=500 mg/dL — AB
Ketones, ur: 5 mg/dL — AB
Nitrite: POSITIVE — AB
Protein, ur: NEGATIVE mg/dL
Specific Gravity, Urine: 1.032 — ABNORMAL HIGH (ref 1.005–1.030)
pH: 6 (ref 5.0–8.0)

## 2020-02-08 LAB — CBC
HCT: 41.3 % (ref 36.0–46.0)
Hemoglobin: 14.3 g/dL (ref 12.0–15.0)
MCH: 29.1 pg (ref 26.0–34.0)
MCHC: 34.6 g/dL (ref 30.0–36.0)
MCV: 83.9 fL (ref 80.0–100.0)
Platelets: 281 10*3/uL (ref 150–400)
RBC: 4.92 MIL/uL (ref 3.87–5.11)
RDW: 12 % (ref 11.5–15.5)
WBC: 9 10*3/uL (ref 4.0–10.5)
nRBC: 0 % (ref 0.0–0.2)

## 2020-02-08 LAB — COMPREHENSIVE METABOLIC PANEL
ALT: 26 U/L (ref 0–44)
AST: 22 U/L (ref 15–41)
Albumin: 3.7 g/dL (ref 3.5–5.0)
Alkaline Phosphatase: 83 U/L (ref 38–126)
Anion gap: 10 (ref 5–15)
BUN: 9 mg/dL (ref 6–20)
CO2: 23 mmol/L (ref 22–32)
Calcium: 9.3 mg/dL (ref 8.9–10.3)
Chloride: 99 mmol/L (ref 98–111)
Creatinine, Ser: 0.49 mg/dL (ref 0.44–1.00)
GFR calc Af Amer: >60 mL/min (ref >60)
GFR calc non Af Amer: >60 mL/min (ref >60)
Glucose, Bld: 475 mg/dL — ABNORMAL HIGH (ref 70–99)
Potassium: 4.4 mmol/L (ref 3.5–5.1)
Sodium: 132 mmol/L — ABNORMAL LOW (ref 135–145)
Total Bilirubin: 0.7 mg/dL (ref 0.3–1.2)
Total Protein: 7.7 g/dL (ref 6.5–8.1)

## 2020-02-08 LAB — LIPASE, BLOOD: Lipase: 26 U/L (ref 11–51)

## 2020-02-08 LAB — GLUCOSE, CAPILLARY: Glucose-Capillary: 372 mg/dL — ABNORMAL HIGH (ref 70–99)

## 2020-02-08 LAB — POCT PREGNANCY, URINE: Preg Test, Ur: NEGATIVE

## 2020-02-08 MED ORDER — CEPHALEXIN 500 MG PO CAPS: 500.0000 mg | ORAL_CAPSULE | Freq: Two times a day (BID) | ORAL | 0 refills | Status: AC

## 2020-02-08 MED ORDER — SODIUM CHLORIDE 0.9 % IV BOLUS
1000.0000 mL | Freq: Once | INTRAVENOUS | Status: AC
  Administered 2020-02-08: 1000 mL via INTRAVENOUS

## 2020-02-08 MED ORDER — MAGNESIUM CITRATE PO SOLN
1.0000 | Freq: Once | ORAL | Status: AC
  Administered 2020-02-08: 1 via ORAL
  Filled 2020-02-08: qty 296

## 2020-02-08 MED ORDER — POLYETHYLENE GLYCOL 3350 17 GM/SCOOP PO POWD: 17.0000 g | Freq: Every day | ORAL | 1 refills | Status: AC

## 2020-02-08 NOTE — ED Triage Notes (Signed)
Says crampy abd pain with no bm for 2 days.  Mid abd.  Has tanke stool softeners and prune juice.

## 2020-02-08 NOTE — ED Notes (Signed)
Pt assisted to toilet 

## 2020-02-08 NOTE — ED Notes (Signed)
Pt c/o nausea with food or drink intake, and diffuse abdominal pain that started today. Pt states she feels like she needs to have a BM but is unable to go despite prune juice/OTC stool softener- reports last BM was yesterday. Pt denies emesis, diarrhea.

## 2020-02-08 NOTE — ED Provider Notes (Signed)
[facility] Emergency Department Provider Note ____________________________________________   First MD Initiated Contact with Patient 02/08/20 1826     (approximate)  I have reviewed the triage vital signs and the nursing notes.   HISTORY  Chief Complaint Abdominal Pain and Constipation  HPI Carolyn Andrews is a 29 y.o. female with history of diabetes presents to the emergency department for treatment and evaluation of abdominal cramping and constipation. No bowel movement for the past 2 days. She has the urge but unable to defecate. No relief with prune juice or stool softner.     Past Medical History:  Diagnosis Date  . Diabetes ([facility])     There are no problems to display for this patient.   History reviewed. No pertinent surgical history.  Prior to Admission medications   Medication Sig Start Date End Date Taking? Authorizing Provider  cephALEXin (KEFLEX) 500 MG capsule Take 1 capsule (500 mg total) by mouth 2 (two) times daily for 7 days. 02/08/20 02/15/20  Quaran Kedzierski, Rulon Eisenmenger B, FNP  FLUoxetine (PROZAC) 20 MG capsule Take 20 mg by mouth daily.    [provider]  glipiZIDE (GLUCOTROL) 10 MG tablet Take 10 mg by mouth daily before breakfast.    [provider]  metFORMIN (GLUCOPHAGE) 1000 MG tablet Take 1 tablet (1,000 mg total) by mouth 2 (two) times daily with a meal. 10/30/14 10/30/15  Rebecka Apley, MD  neomycin-polymyxin-pramoxine (NEOSPORIN PLUS) 1 % cream Apply topically 2 (two) times daily. 11/29/15   Joni Reining, PA-C  polyethylene glycol powder [facility]) 17 GM/SCOOP powder Take 17 g by mouth daily. 02/08/20 02/07/21  Loi Rennaker, Rulon Eisenmenger B, FNP  sulfamethoxazole-trimethoprim (BACTRIM DS) 800-160 MG tablet Take 1 tablet by mouth 2 (two) times daily. 12/08/18   Enid Derry, PA-C    Allergies Patient has no known allergies.  Family History  Problem Relation Age of Onset  . Diabetes Mother     Social History Social  History   Tobacco Use  . Smoking status: Never Smoker  . Smokeless tobacco: Never Used  Substance Use Topics  . Alcohol use: No  . Drug use: Not on file    Review of Systems  Constitutional: No fever/chills Eyes: No visual changes. ENT: No sore throat. Cardiovascular: Denies chest pain. Respiratory: Denies shortness of breath. Gastrointestinal: Positive for abdominal cramping and constipation. Genitourinary: Negative for dysuria. Musculoskeletal: Negative for back pain. Skin: Negative for rash. Neurological: Negative for headaches, focal weakness or numbness. ___________________________________________   PHYSICAL EXAM:  VITAL SIGNS: ED Triage Vitals  Enc Vitals Group     BP 02/08/20 1751 140/79     Pulse Rate 02/08/20 1751 97     Resp 02/08/20 1751 16     Temp 02/08/20 1751 98.6 F (37 C)     Temp Source 02/08/20 1751 Oral     SpO2 02/08/20 1751 98 %     Weight 02/08/20 1752 190 lb (86.2 kg)     Height 02/08/20 1752 5' (1.524 m)     Head Circumference --      Peak Flow --      Pain Score 02/08/20 1751 8     Pain Loc --      Pain Edu? --      Excl. in [place]? --     Constitutional: Alert and oriented. Well appearing and in no acute distress. Eyes: Conjunctivae are normal. Head: Atraumatic. Nose: No congestion/rhinnorhea. Mouth/Throat: Mucous membranes are moist.  Oropharynx non-erythematous. Neck: No stridor.  Cardiovascular: Normal rate, regular rhythm.  Good peripheral circulation. Respiratory: Normal respiratory effort.  No retractions. Lungs CTAB. Gastrointestinal: Soft and nontender. No distention. No abdominal bruits. Bowel sounds present x 4 quadrants. Genitourinary:  Musculoskeletal: No lower extremity tenderness nor edema.  No joint effusions. Neurologic:  Normal speech and language. No gross focal neurologic deficits are appreciated. No gait instability. Skin:  Skin is warm, dry and intact. No rash noted. Psychiatric: Mood and affect are normal.  Speech and behavior are normal.  ____________________________________________   LABS (all labs ordered are listed, but only abnormal results are displayed)  Labs Reviewed  COMPREHENSIVE METABOLIC PANEL - Abnormal; Notable for the following components:      Result Value   Sodium 132 (*)    Glucose, Bld 475 (*)    All other components within normal limits  URINALYSIS, COMPLETE (UACMP) WITH MICROSCOPIC - Abnormal; Notable for the following components:   Color, Urine YELLOW (*)    APPearance HAZY (*)    Specific Gravity, Urine 1.032 (*)    Glucose, UA >=500 (*)    Hgb urine dipstick SMALL (*)    Ketones, ur 5 (*)    Nitrite POSITIVE (*)    Leukocytes,Ua LARGE (*)    Bacteria, UA FEW (*)    All other components within normal limits  GLUCOSE, CAPILLARY - Abnormal; Notable for the following components:   Glucose-Capillary 372 (*)    All other components within normal limits  LIPASE, BLOOD  CBC  POC URINE PREG, ED  POCT PREGNANCY, URINE  CBG MONITORING, ED   ____________________________________________  EKG  Not indicated. ____________________________________________  RADIOLOGY  ED MD interpretation:    1 view image of the abdomen shows normal bowel gas pattern with retained stool in the sigmoid and descending colon.  I, Kem Boroughs, personally viewed and evaluated these images (plain radiographs) as part of my medical decision making, as well as reviewing the written report by the radiologist.  Official radiology report(s): DG Abdomen 1 View  Result Date: 02/08/2020 CLINICAL DATA:  Diffuse abdominal pain. EXAM: ABDOMEN - 1 VIEW COMPARISON:  None. FINDINGS: The bowel gas pattern is normal. A marked amount of stool is seen within the descending and sigmoid colon. No radio-opaque calculi or other significant radiographic abnormality are seen. IMPRESSION: Negative. Electronically Signed   By: Aram Candela M.D.   On: 02/08/2020 19:15     ____________________________________________   PROCEDURES  Procedure(s) performed (including Critical Care):  Procedures  ____________________________________________   INITIAL IMPRESSION / ASSESSMENT AND PLAN     29 year old female presenting to the emergency department for treatment and evaluation of abdominal cramping and constipation x2 days.  See HPI for further details.  Plan will be to review labs drawn while awaiting ER room assignment,and obtain an x-ray of the abdomen  DIFFERENTIAL DIAGNOSIS  Constipation, small bowel obstruction,  ED COURSE  Labs show hyperglycemia at 475 with an anion gap of 10.  Patient is not currently taking any medications for her type 2 diabetes.  She states that she was working with her primary care provider to find "the right medicine for her."  CBC is unremarkable.  Urinalysis shows nitrate positive with large leukocytes 21-50 white blood cells and few bacteria.  Patient denies dysuria, frequency, or hematuria.  X-ray image of the abdomen does show retained stool in the sigmoid and descending colon.  To be given a bottle of magnesium citrate and encouraged to take MiraLAX daily.  Despite negative symptoms of UTI, she does  complain of lower abdominal cramping sensation.  Plan will be to treat her with Keflex and have her follow-up with her primary care provider.  Her glucose is significantly elevated as well.  Patient is noncompliant with her diabetes diet or medications.  She did state that she took it today but had not taken it for several days prior to.  She was given fluids and glucose decreased.  She will be discharged home and advised to maintain a diabetic diet and take her medications as prescribed.    ___________________________________________   FINAL CLINICAL IMPRESSION(S) / ED DIAGNOSES  Final diagnoses:  Constipation, unspecified constipation type  Hyperglycemia  Acute cystitis with hematuria     ED Discharge Orders          Ordered    cephALEXin (KEFLEX) 500 MG capsule  2 times daily        02/08/20 2022    polyethylene glycol powder (GLYCOLAX) 17 GM/SCOOP powder  Daily        02/08/20 2022           Carolyn Andrews was evaluated in Emergency Department on 02/08/2020 for the symptoms described in the history of present illness. She was evaluated in the context of the global COVID-19 pandemic, which necessitated consideration that the patient might be at risk for infection with the SARS-CoV-2 virus that causes COVID-19. Institutional protocols and algorithms that pertain to the evaluation of patients at risk for COVID-19 are in a state of rapid change based on information released by regulatory bodies including the CDC and federal and state organizations. These policies and algorithms were followed during the patient's care in the ED.   Note:  This document was prepared using Dragon voice recognition software and may include unintentional dictation errors.   Chinita Pester, FNP 02/08/20 2246    Delton Prairie, MD 02/08/20 [phone]

## 2020-02-08 NOTE — Discharge Instructions (Signed)
Drink the magnesium citrate. Take the Miralax daily unless you start to have diarrhea. See your primary care provider if not improving over the week. Take you diabetes medication as prescribed. Maintain a diabetic diet.

## 2020-02-11 ENCOUNTER — Emergency Department: Payer: Self-pay

## 2020-02-11 ENCOUNTER — Emergency Department
Admission: EM | Admit: 2020-02-11 | Discharge: 2020-02-11 | Disposition: A | Discharge status: Home / Self Care | Payer: Self-pay | Attending: Emergency Medicine | Admitting: Emergency Medicine

## 2020-02-11 ENCOUNTER — Other Ambulatory Visit: Payer: Self-pay

## 2020-02-11 DIAGNOSIS — E119 Type 2 diabetes mellitus without complications: Secondary | ICD-10-CM | POA: Insufficient documentation

## 2020-02-11 DIAGNOSIS — R1010 Upper abdominal pain, unspecified: Secondary | ICD-10-CM

## 2020-02-11 DIAGNOSIS — Z79899 Other long term (current) drug therapy: Secondary | ICD-10-CM | POA: Insufficient documentation

## 2020-02-11 DIAGNOSIS — R1011 Right upper quadrant pain: Secondary | ICD-10-CM | POA: Insufficient documentation

## 2020-02-11 LAB — COMPREHENSIVE METABOLIC PANEL
ALT: 26 U/L (ref 0–44)
AST: 21 U/L (ref 15–41)
Albumin: 3.4 g/dL — ABNORMAL LOW (ref 3.5–5.0)
Alkaline Phosphatase: 80 U/L (ref 38–126)
Anion gap: 8 (ref 5–15)
BUN: 13 mg/dL (ref 6–20)
CO2: 24 mmol/L (ref 22–32)
Calcium: 8.5 mg/dL — ABNORMAL LOW (ref 8.9–10.3)
Chloride: 101 mmol/L (ref 98–111)
Creatinine, Ser: 0.52 mg/dL (ref 0.44–1.00)
GFR calc Af Amer: >60 mL/min (ref >60)
GFR calc non Af Amer: >60 mL/min (ref >60)
Glucose, Bld: 343 mg/dL — ABNORMAL HIGH (ref 70–99)
Potassium: 3.9 mmol/L (ref 3.5–5.1)
Sodium: 133 mmol/L — ABNORMAL LOW (ref 135–145)
Total Bilirubin: 0.6 mg/dL (ref 0.3–1.2)
Total Protein: 7.4 g/dL (ref 6.5–8.1)

## 2020-02-11 LAB — URINALYSIS, COMPLETE (UACMP) WITH MICROSCOPIC
Bacteria, UA: NONE SEEN
Bilirubin Urine: NEGATIVE
Glucose, UA: >=500 mg/dL — AB
Ketones, ur: 5 mg/dL — AB
Nitrite: NEGATIVE
Protein, ur: NEGATIVE mg/dL
Specific Gravity, Urine: 1.032 — ABNORMAL HIGH (ref 1.005–1.030)
pH: 6 (ref 5.0–8.0)

## 2020-02-11 LAB — CBC
HCT: 37.9 % (ref 36.0–46.0)
Hemoglobin: 13.3 g/dL (ref 12.0–15.0)
MCH: 29.2 pg (ref 26.0–34.0)
MCHC: 35.1 g/dL (ref 30.0–36.0)
MCV: 83.3 fL (ref 80.0–100.0)
Platelets: 251 10*3/uL (ref 150–400)
RBC: 4.55 MIL/uL (ref 3.87–5.11)
RDW: 12.2 % (ref 11.5–15.5)
WBC: 8.5 10*3/uL (ref 4.0–10.5)
nRBC: 0 % (ref 0.0–0.2)

## 2020-02-11 LAB — LIPASE, BLOOD: Lipase: 33 U/L (ref 11–51)

## 2020-02-11 LAB — POCT PREGNANCY, URINE: Preg Test, Ur: NEGATIVE

## 2020-02-11 MED ORDER — ONDANSETRON 4 MG PO TBDP
4.0000 mg | ORAL_TABLET | Freq: Once | ORAL | Status: AC
  Administered 2020-02-11: 4 mg via ORAL
  Filled 2020-02-11: qty 1

## 2020-02-11 MED ORDER — KETOROLAC TROMETHAMINE 30 MG/ML IJ SOLN
30.0000 mg | Freq: Once | INTRAMUSCULAR | Status: AC
  Administered 2020-02-11: 30 mg via INTRAMUSCULAR
  Filled 2020-02-11: qty 1

## 2020-02-11 MED ORDER — PANTOPRAZOLE SODIUM 20 MG PO TBEC: 20.0000 mg | DELAYED_RELEASE_TABLET | Freq: Every day | ORAL | 1 refills | Status: DC

## 2020-02-11 MED ORDER — SUCRALFATE 1 G PO TABS: 1.0000 g | ORAL_TABLET | Freq: Three times a day (TID) | ORAL | 0 refills | Status: DC

## 2020-02-11 NOTE — ED Provider Notes (Signed)
[facility] Emergency Department Provider Note   ____________________________________________    I have reviewed the triage vital signs and the nursing notes.   HISTORY  Chief Complaint Abdominal Pain     HPI Carolyn Andrews is a 29 y.o. female with history of diabetes who was recently seen for constipation and urinary tract infection, has been taking a biotics reportedly.  Also had a normal bowel movement but reports now is having right upper quadrant abdominal pain, seems to be worse with eating.  No fevers or chills.  Has not take anything for this.  No history of abdominal surgery.  Positive nausea no vomiting.  Past Medical History:  Diagnosis Date  . Diabetes ([facility])     There are no problems to display for this patient.   No past surgical history on file.  Prior to Admission medications   Medication Sig Start Date End Date Taking? Authorizing Provider  cephALEXin (KEFLEX) 500 MG capsule Take 1 capsule (500 mg total) by mouth 2 (two) times daily for 7 days. 02/08/20 02/15/20  Triplett, Rulon Eisenmenger B, FNP  FLUoxetine (PROZAC) 20 MG capsule Take 20 mg by mouth daily.    [provider]  glipiZIDE (GLUCOTROL) 10 MG tablet Take 10 mg by mouth daily before breakfast.    [provider]  metFORMIN (GLUCOPHAGE) 1000 MG tablet Take 1 tablet (1,000 mg total) by mouth 2 (two) times daily with a meal. 10/30/14 10/30/15  Rebecka Apley, MD  neomycin-polymyxin-pramoxine (NEOSPORIN PLUS) 1 % cream Apply topically 2 (two) times daily. 11/29/15   Joni Reining, PA-C  polyethylene glycol powder [facility]) 17 GM/SCOOP powder Take 17 g by mouth daily. 02/08/20 02/07/21  Triplett, Rulon Eisenmenger B, FNP  sulfamethoxazole-trimethoprim (BACTRIM DS) 800-160 MG tablet Take 1 tablet by mouth 2 (two) times daily. 12/08/18   Enid Derry, PA-C     Allergies Patient has no known allergies.  Family History  Problem Relation Age of Onset  . Diabetes Mother      Social History Social History   Tobacco Use  . Smoking status: Never Smoker  . Smokeless tobacco: Never Used  Substance Use Topics  . Alcohol use: No  . Drug use: Not Currently    Review of Systems  Constitutional: No fever/chills Eyes: No visual changes.  ENT: No sore throat. Cardiovascular: Denies chest pain. Respiratory: Denies shortness of breath. Gastrointestinal: Right upper quadrant pain Genitourinary: Negative for dysuria. Musculoskeletal: Negative for back pain. Skin: Negative for rash. Neurological: Negative for headaches    ____________________________________________   PHYSICAL EXAM:  VITAL SIGNS: ED Triage Vitals  Enc Vitals Group     BP 02/11/20 0727 137/72     Pulse Rate 02/11/20 0727 69     Resp 02/11/20 0727 16     Temp 02/11/20 0727 98.1 F (36.7 C)     Temp Source 02/11/20 0727 Oral     SpO2 02/11/20 0727 100 %     Weight 02/11/20 0729 88.5 kg (195 lb)     Height 02/11/20 0729 1.524 m (5')     Head Circumference --      Peak Flow --      Pain Score 02/11/20 0728 8     Pain Loc --      Pain Edu? --      Excl. in [place]? --     Constitutional: Alert and oriented.  Nose: No congestion/rhinnorhea. Mouth/Throat: Mucous membranes are moist.    Cardiovascular: Normal rate, regular rhythm.  Good peripheral circulation. Respiratory: Normal respiratory effort.  No retractions. Lungs CTAB. Gastrointestinal: Soft, tender in the right upper quadrant.  No distention.  No CVA tenderness.  Musculoskeletal: No lower extremity tenderness nor edema.  Warm and well perfused Neurologic:  Normal speech and language. No gross focal neurologic deficits are appreciated.  Skin:  Skin is warm, dry and intact. No rash noted. Psychiatric: Mood and affect are normal. Speech and behavior are normal.  ____________________________________________   LABS (all labs ordered are listed, but only abnormal results are displayed)  Labs Reviewed  COMPREHENSIVE  METABOLIC PANEL - Abnormal; Notable for the following components:      Result Value   Sodium 133 (*)    Glucose, Bld 343 (*)    Calcium 8.5 (*)    Albumin 3.4 (*)    All other components within normal limits  URINALYSIS, COMPLETE (UACMP) WITH MICROSCOPIC - Abnormal; Notable for the following components:   Color, Urine STRAW (*)    APPearance HAZY (*)    Specific Gravity, Urine 1.032 (*)    Glucose, UA >=500 (*)    Hgb urine dipstick SMALL (*)    Ketones, ur 5 (*)    Leukocytes,Ua MODERATE (*)    All other components within normal limits  LIPASE, BLOOD  CBC  POC URINE PREG, ED  POCT PREGNANCY, URINE   ____________________________________________  EKG  None ____________________________________________  RADIOLOGY  Ultrasound ____________________________________________   PROCEDURES  Procedure(s) performed: No  Procedures   Critical Care performed: No ____________________________________________   INITIAL IMPRESSION / ASSESSMENT AND PLAN / ED COURSE  Pertinent labs & imaging results that were available during my care of the patient were reviewed by me and considered in my medical decision making (see chart for details).  Patient presents with right upper quadrant abdominal pain as described above,  Differential includes gastritis, cholelithiasis/cholecystitis  We will treat with IM Toradol, p.o. Zofran obtain ultrasound of the right upper quadrant.  Ultrasound reviewed by me, no evidence o of stone or wall thickening on my review, pending radiology read  Lab work is reassuring, normal LFTs, normal white blood cell count.  Ultrasound read as normal by radiology  Reevaluated the patient, no further abdominal tenderness.  Suspect gastritis, will start PPI, Carafate, have the patient follow-up closely with her PCP      ____________________________________________   FINAL CLINICAL IMPRESSION(S) / ED DIAGNOSES  Final diagnoses:  Upper abdominal pain         Note:  This document was prepared using Dragon voice recognition software and may include unintentional dictation errors.   Jene Every, MD 02/11/20 [phone]

## 2020-02-11 NOTE — ED Triage Notes (Signed)
Pt c/o epigastric pain radiating into the RUQ since Thursday with nausea. Denies vomiting or diarrhea. States she was seen on Thursday for same but thought it was due to constipation, states she has had an normal BM since then

## 2020-05-05 ENCOUNTER — Emergency Department
Admission: EM | Admit: 2020-05-05 | Discharge: 2020-05-05 | Disposition: A | Discharge status: Home / Self Care | Payer: Self-pay | Attending: Emergency Medicine | Admitting: Emergency Medicine

## 2020-05-05 ENCOUNTER — Other Ambulatory Visit: Payer: Self-pay

## 2020-05-05 ENCOUNTER — Encounter: Payer: Self-pay | Admitting: Emergency Medicine

## 2020-05-05 DIAGNOSIS — H66003 Acute suppurative otitis media without spontaneous rupture of ear drum, bilateral: Secondary | ICD-10-CM | POA: Insufficient documentation

## 2020-05-05 DIAGNOSIS — E119 Type 2 diabetes mellitus without complications: Secondary | ICD-10-CM | POA: Insufficient documentation

## 2020-05-05 DIAGNOSIS — H66006 Acute suppurative otitis media without spontaneous rupture of ear drum, recurrent, bilateral: Secondary | ICD-10-CM

## 2020-05-05 DIAGNOSIS — Z79899 Other long term (current) drug therapy: Secondary | ICD-10-CM | POA: Insufficient documentation

## 2020-05-05 DIAGNOSIS — Z7984 Long term (current) use of oral hypoglycemic drugs: Secondary | ICD-10-CM | POA: Insufficient documentation

## 2020-05-05 MED ORDER — DOXYCYCLINE HYCLATE 50 MG PO CAPS: 100.0000 mg | ORAL_CAPSULE | Freq: Two times a day (BID) | ORAL | 0 refills | Status: AC

## 2020-05-05 MED ORDER — DOXYCYCLINE HYCLATE 100 MG PO TABS
100.0000 mg | ORAL_TABLET | Freq: Once | ORAL | Status: AC
  Administered 2020-05-05: 04:00:00 100 mg via ORAL
  Filled 2020-05-05: qty 1

## 2020-05-05 NOTE — ED Provider Notes (Signed)
[facility] Emergency Department Provider Note   ____________________________________________   Event Date/Time   First MD Initiated Contact with Patient 05/05/20 0403     (approximate)  I have reviewed the triage vital signs and the nursing notes.   HISTORY  Chief Complaint Otalgia    HPI Carolyn Andrews is a 29 y.o. female with a stated past medical history of diabetes who presents for bilateral ear pain.  Patient states that her right ear began hurting yesterday and when she awoke this morning she had pain in both ears.  Patient states that she gets bilateral ear infections yearly but has never seen ear nose and throat doctor or had ear tubes.  Patient states that she has not had any other upper respiratory symptoms including denies fevers, cough, rhinorrhea, sore throat, or sick contacts         Past Medical History:  Diagnosis Date  . Diabetes (HCC)     There are no problems to display for this patient.   History reviewed. No pertinent surgical history.  Prior to Admission medications   Medication Sig Start Date End Date Taking? Authorizing Provider  FLUoxetine (PROZAC) 20 MG capsule Take 20 mg by mouth daily.    [provider]  glipiZIDE (GLUCOTROL) 10 MG tablet Take 10 mg by mouth daily before breakfast.    [provider]  metFORMIN (GLUCOPHAGE) 1000 MG tablet Take 1 tablet (1,000 mg total) by mouth 2 (two) times daily with a meal. 10/30/14 10/30/15  Rebecka Apley, MD  neomycin-polymyxin-pramoxine (NEOSPORIN PLUS) 1 % cream Apply topically 2 (two) times daily. 11/29/15   Joni Reining, PA-C  pantoprazole (PROTONIX) 20 MG tablet Take 1 tablet (20 mg total) by mouth daily. 02/11/20 02/10/21  Jene Every, MD  polyethylene glycol powder [facility]) 17 GM/SCOOP powder Take 17 g by mouth daily. 02/08/20 02/07/21  Triplett, Kasandra Knudsen, FNP  sucralfate (CARAFATE) 1 g tablet Take 1 tablet (1 g total) by mouth 4 (four) times  daily -  with meals and at bedtime. 02/11/20   Jene Every, MD  sulfamethoxazole-trimethoprim (BACTRIM DS) 800-160 MG tablet Take 1 tablet by mouth 2 (two) times daily. 12/08/18   Enid Derry, PA-C    Allergies Patient has no known allergies.  Family History  Problem Relation Age of Onset  . Diabetes Mother     Social History Social History   Tobacco Use  . Smoking status: Never Smoker  . Smokeless tobacco: Never Used  Substance Use Topics  . Alcohol use: No  . Drug use: Never    Review of Systems Constitutional: No fever/chills Eyes: No visual changes. Ears: Endorses bilateral ear pain ENT: No sore throat. Cardiovascular: Denies chest pain. Respiratory: Denies shortness of breath. Gastrointestinal: No abdominal pain.  No nausea, no vomiting.  No diarrhea. Genitourinary: Negative for dysuria. Musculoskeletal: Negative for acute arthralgias Skin: Negative for rash. Neurological: Negative for headaches, weakness/numbness/paresthesias in any extremity Psychiatric: Negative for suicidal ideation/homicidal ideation   ____________________________________________   PHYSICAL EXAM:  VITAL SIGNS: ED Triage Vitals  Enc Vitals Group     BP 05/05/20 0358 (!) 146/71     Pulse Rate 05/05/20 0358 63     Resp 05/05/20 0358 18     Temp 05/05/20 0358 (!) 97.5 F (36.4 C)     Temp Source 05/05/20 0358 Oral     SpO2 05/05/20 0358 100 %     Weight 05/05/20 0359 195 lb (88.5 kg)     Height  05/05/20 0359 5' (1.524 m)     Head Circumference --      Peak Flow --      Pain Score 05/05/20 0359 8     Pain Loc --      Pain Edu? --      Excl. in GC? --    Constitutional: Alert and oriented. Well appearing and in no acute distress. Eyes: Conjunctivae are normal. PERRL. Head: Atraumatic. Ears: Bilateral tympanic membranes bulging, erythematous, and with purulent material behind Nose: No congestion/rhinnorhea. Mouth/Throat: Mucous membranes are moist. Neck: No  stridor Cardiovascular: Grossly normal heart sounds.  Good peripheral circulation. Respiratory: Normal respiratory effort.  No retractions. Gastrointestinal: Soft and nontender. No distention. Musculoskeletal: No obvious deformities Neurologic:  Normal speech and language. No gross focal neurologic deficits are appreciated. Skin:  Skin is warm and dry. No rash noted. Psychiatric: Mood and affect are normal. Speech and behavior are normal.  ____________________________________________   LABS (all labs ordered are listed, but only abnormal results are displayed)  Labs Reviewed - No data to display   PROCEDURES  Procedure(s) performed (including Critical Care):  Procedures   ____________________________________________   INITIAL IMPRESSION / ASSESSMENT AND PLAN / ED COURSE  As part of my medical decision making, I reviewed the following data within the electronic MEDICAL RECORD NUMBER Nursing notes reviewed and incorporated, Labs reviewed, Old chart reviewed, and Notes from prior ED visits reviewed and incorporated      Exam and history most consistent with AOM. I have a low suspicion at this time for mastoiditis, malignant otitis externa, herpes, retained foreign body.  Rx: Doxycycline Disposition: Discharge. If symptoms worsen or persist for 48-72 then pt to fill the prescription. Cautious return precautions discussed w/ full understanding.      ____________________________________________   FINAL CLINICAL IMPRESSION(S) / ED DIAGNOSES  Final diagnoses:  Recurrent acute suppurative otitis media without spontaneous rupture of tympanic membrane of both sides     ED Discharge Orders    None       Note:  This document was prepared using Dragon voice recognition software and may include unintentional dictation errors.   Merwyn Katos, MD 05/05/20 [phone]

## 2020-05-05 NOTE — ED Triage Notes (Signed)
Patient with complaint of bilateral ear pain that started this morning.

## 2021-03-05 ENCOUNTER — Encounter: Payer: Self-pay | Admitting: Emergency Medicine

## 2021-03-05 ENCOUNTER — Other Ambulatory Visit: Payer: Self-pay

## 2021-03-05 ENCOUNTER — Emergency Department
Admission: EM | Admit: 2021-03-05 | Discharge: 2021-03-05 | Disposition: A | Discharge status: Home / Self Care | Payer: Self-pay | Attending: Emergency Medicine | Admitting: Emergency Medicine

## 2021-03-05 DIAGNOSIS — R739 Hyperglycemia, unspecified: Secondary | ICD-10-CM

## 2021-03-05 DIAGNOSIS — D72829 Elevated white blood cell count, unspecified: Secondary | ICD-10-CM | POA: Insufficient documentation

## 2021-03-05 DIAGNOSIS — E1165 Type 2 diabetes mellitus with hyperglycemia: Secondary | ICD-10-CM | POA: Insufficient documentation

## 2021-03-05 DIAGNOSIS — Z7984 Long term (current) use of oral hypoglycemic drugs: Secondary | ICD-10-CM | POA: Insufficient documentation

## 2021-03-05 DIAGNOSIS — B379 Candidiasis, unspecified: Secondary | ICD-10-CM | POA: Insufficient documentation

## 2021-03-05 DIAGNOSIS — K5900 Constipation, unspecified: Secondary | ICD-10-CM | POA: Insufficient documentation

## 2021-03-05 DIAGNOSIS — N760 Acute vaginitis: Secondary | ICD-10-CM | POA: Insufficient documentation

## 2021-03-05 DIAGNOSIS — B3731 Acute candidiasis of vulva and vagina: Secondary | ICD-10-CM

## 2021-03-05 DIAGNOSIS — N390 Urinary tract infection, site not specified: Secondary | ICD-10-CM | POA: Insufficient documentation

## 2021-03-05 LAB — URINALYSIS, COMPLETE (UACMP) WITH MICROSCOPIC
Bilirubin Urine: NEGATIVE
Glucose, UA: >=500 mg/dL — AB
Ketones, ur: 20 mg/dL — AB
Nitrite: NEGATIVE
Protein, ur: 30 mg/dL — AB
Specific Gravity, Urine: 1.039 — ABNORMAL HIGH (ref 1.005–1.030)
pH: 9 — ABNORMAL HIGH (ref 5.0–8.0)

## 2021-03-05 LAB — CBC
HCT: 41 % (ref 36.0–46.0)
Hemoglobin: 14.4 g/dL (ref 12.0–15.0)
MCH: 29.8 pg (ref 26.0–34.0)
MCHC: 35.1 g/dL (ref 30.0–36.0)
MCV: 84.9 fL (ref 80.0–100.0)
Platelets: 281 10*3/uL (ref 150–400)
RBC: 4.83 MIL/uL (ref 3.87–5.11)
RDW: 12.1 % (ref 11.5–15.5)
WBC: 13.4 10*3/uL — ABNORMAL HIGH (ref 4.0–10.5)
nRBC: 0 % (ref 0.0–0.2)

## 2021-03-05 LAB — COMPREHENSIVE METABOLIC PANEL
ALT: 38 U/L (ref 0–44)
AST: 31 U/L (ref 15–41)
Albumin: 3.6 g/dL (ref 3.5–5.0)
Alkaline Phosphatase: 79 U/L (ref 38–126)
Anion gap: 6 (ref 5–15)
BUN: 14 mg/dL (ref 6–20)
CO2: 29 mmol/L (ref 22–32)
Calcium: 8.9 mg/dL (ref 8.9–10.3)
Chloride: 98 mmol/L (ref 98–111)
Creatinine, Ser: 0.51 mg/dL (ref 0.44–1.00)
GFR, Estimated: >60 mL/min (ref >60)
Glucose, Bld: 395 mg/dL — ABNORMAL HIGH (ref 70–99)
Potassium: 3.9 mmol/L (ref 3.5–5.1)
Sodium: 133 mmol/L — ABNORMAL LOW (ref 135–145)
Total Bilirubin: 0.8 mg/dL (ref 0.3–1.2)
Total Protein: 7.3 g/dL (ref 6.5–8.1)

## 2021-03-05 LAB — POC URINE PREG, ED: Preg Test, Ur: NEGATIVE

## 2021-03-05 LAB — LIPASE, BLOOD: Lipase: 29 U/L (ref 11–51)

## 2021-03-05 MED ORDER — FLUCONAZOLE 50 MG PO TABS
150.0000 mg | ORAL_TABLET | Freq: Once | ORAL | Status: AC
  Administered 2021-03-05: 150 mg via ORAL
  Filled 2021-03-05: qty 1

## 2021-03-05 MED ORDER — BARRIER CREAM NON-SPECIFIED
1.0000 "application " | TOPICAL_CREAM | TOPICAL | Status: DC | PRN
  Administered 2021-03-05: 1 via TOPICAL
  Filled 2021-03-05: qty 1

## 2021-03-05 MED ORDER — METFORMIN HCL 1000 MG PO TABS: 1000.0000 mg | ORAL_TABLET | Freq: Two times a day (BID) | ORAL | 0 refills | Status: DC

## 2021-03-05 MED ORDER — CEPHALEXIN 500 MG PO CAPS: 500.0000 mg | ORAL_CAPSULE | Freq: Two times a day (BID) | ORAL | 0 refills | Status: DC

## 2021-03-05 MED ORDER — FLEET ENEMA 7-19 GM/118ML RE ENEM
1.0000 | ENEMA | Freq: Once | RECTAL | Status: AC
  Administered 2021-03-05: 1 via RECTAL

## 2021-03-05 MED ORDER — POLYETHYLENE GLYCOL 3350 17 G PO PACK: 17.0000 g | PACK | Freq: Every day | ORAL | 0 refills | Status: DC

## 2021-03-05 MED ORDER — DOCUSATE SODIUM 100 MG PO CAPS: 100.0000 mg | ORAL_CAPSULE | Freq: Two times a day (BID) | ORAL | 2 refills | Status: AC

## 2021-03-05 MED ORDER — ONDANSETRON 4 MG PO TBDP: 4.0000 mg | ORAL_TABLET | Freq: Three times a day (TID) | ORAL | 0 refills | Status: DC | PRN

## 2021-03-05 MED ORDER — FLUCONAZOLE 150 MG PO TABS: 150.0000 mg | ORAL_TABLET | Freq: Once | ORAL | 0 refills | Status: AC

## 2021-03-05 MED ORDER — CEPHALEXIN 500 MG PO CAPS
500.0000 mg | ORAL_CAPSULE | Freq: Once | ORAL | Status: AC
  Administered 2021-03-05: 500 mg via ORAL
  Filled 2021-03-05: qty 1

## 2021-03-05 MED ORDER — LIDOCAINE 5 % EX OINT: 1.0000 "application " | TOPICAL_OINTMENT | CUTANEOUS | 0 refills | Status: DC | PRN

## 2021-03-05 NOTE — Discharge Instructions (Addendum)
I recommend that you increase your water and fiber intake. If you are not able to eat foods high in fiber, you may use Benefiber or Metamucil over-the-counter. I also recommend you use MiraLAX 1-2 times a day and Colace 100 mg twice a day to help with bowel movements. These medications are over the counter.  You may use other over-the-counter medications such as dulcolax, senna, Fleet enemas, magnesium citrate as needed for constipation. Please note that some of these medications may cause you to have abdominal cramping which is normal. If you develop severe abdominal pain, fever (temperature of 100.4 or higher), persistent vomiting, distention of your abdomen, unable to have a bowel movement for 5 days or are not passing gas, please return to the hospital.  We have provided you with a prescription for lidocaine which you may use around your rectum for your rectal pain.  I recommend that you do not use toilet paper at this time but use a damp rag, baby wipes or warm soaks in the bathtub after having bowel movements to help with your pain due to skin breakdown.  You may use over-the-counter Tucks pads to help with pain around your rectum.  You may also use over-the-counter Desitin barrier cream to help protect this skin.   You may alternate Tylenol 1000 mg every 6 hours as needed for pain, fever and Ibuprofen 800 mg every 8 hours as needed for pain, fever.  Please take Ibuprofen with food.  Do not take more than 4000 mg of Tylenol (acetaminophen) in a 24 hour period.   Steps to find a Primary Care Provider (PCP):  Call [phone] or [phone] to access "[place] Find a Doctor Service."  2.  You may also go on the [facility] website at [email or web address]

## 2021-03-05 NOTE — ED Provider Notes (Signed)
[facility] Emergency Department Provider Note  ____________________________________________   Event Date/Time   First MD Initiated Contact with Patient 03/05/21 0345     (approximate)  I have reviewed the triage vital signs and the nursing notes.   HISTORY  Chief Complaint Constipation and Abdominal Pain    HPI Carolyn Andrews is a 30 y.o. female with history of obesity, non-insulin-dependent diabetes, intermittent constipation who presents to the emergency department with concerns for constipation.  States she has not been able to have a normal bowel movement in 2 days.  States she has tried MiraLAX once at home without relief.  States she took magnesium citrate around 6 PM tonight and now is having diarrhea but still feels that there is a ball of something in her rectum and she feels the urge to push it out but is not able to pass it.  She denies any fevers, chills, nausea, vomiting, abdominal pain or distention.  She is passing gas.  No dysuria, hematuria, vaginal bleeding or discharge.  Patient is also wondering if this is due to her blood sugars being elevated.  States she has not taking her metformin as it causes her GI upset.  States she does not currently have a primary care doctor.        Past Medical History:  Diagnosis Date   Diabetes (HCC)     There are no problems to display for this patient.   History reviewed. No pertinent surgical history.  Prior to Admission medications   Medication Sig Start Date End Date Taking? Authorizing Provider  cephALEXin (KEFLEX) 500 MG capsule Take 1 capsule (500 mg total) by mouth 2 (two) times daily. 03/05/21  Yes Onna Nodal, Layla Maw, DO  docusate sodium (COLACE) 100 MG capsule Take 1 capsule (100 mg total) by mouth 2 (two) times daily. 03/05/21 03/05/22 Yes Jatavian Calica N, DO  fluconazole (DIFLUCAN) 150 MG tablet Take 1 tablet (150 mg total) by mouth once for 1 dose. May take when antibiotics  complete as needed for yeast infection 03/05/21 03/05/21 Yes Braxton Vantrease, Baxter Hire N, DO  lidocaine (XYLOCAINE) 5 % ointment Apply 1 application topically as needed. 03/05/21  Yes Petina Muraski N, DO  ondansetron (ZOFRAN ODT) 4 MG disintegrating tablet Take 1 tablet (4 mg total) by mouth every 8 (eight) hours as needed for nausea or vomiting. 03/05/21  Yes Harshita Bernales, Layla Maw, DO  polyethylene glycol (MIRALAX) 17 g packet Take 17 g by mouth daily. 03/05/21  Yes Berdia Lachman, Layla Maw, DO  FLUoxetine (PROZAC) 20 MG capsule Take 20 mg by mouth daily.    [provider]  glipiZIDE (GLUCOTROL) 10 MG tablet Take 10 mg by mouth daily before breakfast.    [provider]  metFORMIN (GLUCOPHAGE) 1000 MG tablet Take 1 tablet (1,000 mg total) by mouth 2 (two) times daily with a meal. 03/05/21 03/05/22  Inocencia Murtaugh, Layla Maw, DO  neomycin-polymyxin-pramoxine (NEOSPORIN PLUS) 1 % cream Apply topically 2 (two) times daily. 11/29/15   Joni Reining, PA-C  pantoprazole (PROTONIX) 20 MG tablet Take 1 tablet (20 mg total) by mouth daily. 02/11/20 02/10/21  Jene Every, MD  sucralfate (CARAFATE) 1 g tablet Take 1 tablet (1 g total) by mouth 4 (four) times daily -  with meals and at bedtime. 02/11/20   Jene Every, MD  sulfamethoxazole-trimethoprim (BACTRIM DS) 800-160 MG tablet Take 1 tablet by mouth 2 (two) times daily. 12/08/18   Enid Derry, PA-C    Allergies Patient has no known allergies.  Family History  Problem Relation Age of Onset   Diabetes Mother     Social History Social History   Tobacco Use   Smoking status: Never   Smokeless tobacco: Never  Substance Use Topics   Alcohol use: No   Drug use: Never    Review of Systems Constitutional: No fever. Eyes: No visual changes. ENT: No sore throat. Cardiovascular: Denies chest pain. Respiratory: Denies shortness of breath. Gastrointestinal: No nausea, vomiting.  + diarrhea. Genitourinary: Negative for dysuria. Musculoskeletal: Negative for  back pain. Skin: Negative for rash. Neurological: Negative for focal weakness or numbness.  ____________________________________________   PHYSICAL EXAM:  VITAL SIGNS: ED Triage Vitals  Enc Vitals Group     BP 03/05/21 0307 (!) 160/94     Pulse Rate 03/05/21 0307 97     Resp 03/05/21 0307 14     Temp 03/05/21 0307 98.4 F (36.9 C)     Temp Source 03/05/21 0307 Oral     SpO2 03/05/21 0307 100 %     Weight 03/05/21 0308 197 lb (89.4 kg)     Height 03/05/21 0308 5\' 3"  (1.6 m)     Head Circumference --      Peak Flow --      Pain Score 03/05/21 0308 8     Pain Loc --      Pain Edu? --      Excl. in GC? --    CONSTITUTIONAL: Alert and oriented and responds appropriately to questions. Well-appearing; well-nourished HEAD: Normocephalic EYES: Conjunctivae clear, pupils appear equal, EOM appear intact ENT: normal nose; moist mucous membranes NECK: Supple, normal ROM CARD: RRR; S1 and S2 appreciated; no murmurs, no clicks, no rubs, no gallops RESP: Normal chest excursion without splinting or tachypnea; breath sounds clear and equal bilaterally; no wheezes, no rhonchi, no rales, no hypoxia or respiratory distress, speaking full sentences ABD/GI: Normal bowel sounds; non-distended; soft, non-tender, no rebound, no guarding, no peritoneal signs, no hepatosplenomegaly RECTAL:  Normal rectal tone, no gross blood or melena, patient has superficial skin breakdown around the rectum without bleeding, redness or warmth, no hemorrhoids appreciated, tender rectal exam, no fecal impaction.  Soft liquid brown stool in rectal vault. BACK: The back appears normal EXT: Normal ROM in all joints; no deformity noted, no edema; no cyanosis SKIN: Normal color for age and race; warm; no rash on exposed skin NEURO: Moves all extremities equally PSYCH: The patient's mood and manner are appropriate.  ____________________________________________   LABS (all labs ordered are listed, but only abnormal results  are displayed)  Labs Reviewed  COMPREHENSIVE METABOLIC PANEL - Abnormal; Notable for the following components:      Result Value   Sodium 133 (*)    Glucose, Bld 395 (*)    All other components within normal limits  CBC - Abnormal; Notable for the following components:   WBC 13.4 (*)    All other components within normal limits  URINALYSIS, COMPLETE (UACMP) WITH MICROSCOPIC - Abnormal; Notable for the following components:   Color, Urine YELLOW (*)    APPearance CLOUDY (*)    Specific Gravity, Urine 1.039 (*)    pH 9.0 (*)    Glucose, UA >=500 (*)    Hgb urine dipstick SMALL (*)    Ketones, ur 20 (*)    Protein, ur 30 (*)    Leukocytes,Ua LARGE (*)    Bacteria, UA MANY (*)    All other components within normal limits  URINE CULTURE  LIPASE, BLOOD  POC URINE PREG, ED   ____________________________________________  EKG   ____________________________________________  RADIOLOGY I, Maricus Tanzi, personally viewed and evaluated these images (plain radiographs) as part of my medical decision making, as well as reviewing the written report by the radiologist.  ED MD interpretation:    Official radiology report(s): No results found.  ____________________________________________   PROCEDURES  Procedure(s) performed (including Critical Care):  Procedures   ____________________________________________   INITIAL IMPRESSION / ASSESSMENT AND PLAN / ED COURSE  As part of my medical decision making, I reviewed the following data within the electronic MEDICAL RECORD NUMBER Nursing notes reviewed and incorporated, Labs reviewed , Old chart reviewed, and Notes from prior ED visits         Patient here with constipation.  States she took magnesium citrate at home and is now having diarrhea.  Reports multiple episodes of liquid stool at home and now has skin breakdown around her rectum.  Will apply barrier cream to this area and will discharge with lidocaine.  Recommended that she  stop using toilet paper and either use warm washcloths, baby wipes or sitz bath's to clean herself after bowel movement.  No hemorrhoids on exam.  She states she feels that she has a stool ball in her rectum but I do not appreciate any impaction but exam is limited due to patient's intolerance.  Have offered her an enema here but she states she does not think she be able to tolerate it.  She agrees to taking a Fleet enema home to try it at home.  It appears that constipation has been something she has dealt with intermittently for years and has been seen in our ED as well as [facility] for the same.  She does not take any medications regularly to prevent constipation.  I recommended that she start taking Colace 100 mg twice daily.  Also recommended MiraLAX 1-2 times a day as well as increase water and fiber intake.  Her abdominal exam today is benign.  I have no concern currently for bowel obstruction, perforation, colitis, diverticulitis, appendicitis.  Labs show mild leukocytosis and hyperglycemia without DKA.  She states that she does not currently have a primary care doctor as her PCP recently moved.  I will refill her metformin as well as provide her with prescription for Zofran given she states it causes GI upset.  Will provide with information to establish care with a new primary care doctor.  Urine pending.  ED PROGRESS  Pregnancy test negative.  Urine appears infected.  We will add on urine culture and start on Keflex.  She also has yeast in her urine likely due to her uncontrolled diabetes.  She does report having some vaginal irritation.  Will give prescription for Diflucan as well.  Have discussed supportive care instructions at length.  Will give outpatient follow-up.  Patient verbalized understanding and is comfortable with this plan.  At this time, I do not feel there is any life-threatening condition present. I have reviewed, interpreted and discussed all results (EKG, imaging, lab, urine as  appropriate) and exam findings with patient/family. I have reviewed nursing notes and appropriate previous records.  I feel the patient is safe to be discharged home without further emergent workup and can continue workup as an outpatient as needed. Discussed usual and customary return precautions. Patient/family verbalize understanding and are comfortable with this plan.  Outpatient follow-up has been provided as needed. All questions have been answered.  ____________________________________________   FINAL CLINICAL IMPRESSION(S) / ED DIAGNOSES  Final diagnoses:  Constipation, unspecified constipation type  Hyperglycemia  Acute UTI  Yeast vaginitis     ED Discharge Orders          Ordered    metFORMIN (GLUCOPHAGE) 1000 MG tablet  2 times daily with meals        03/05/21 0400    ondansetron (ZOFRAN ODT) 4 MG disintegrating tablet  Every 8 hours PRN        03/05/21 0400    polyethylene glycol (MIRALAX) 17 g packet  Daily        03/05/21 0400    docusate sodium (COLACE) 100 MG capsule  2 times daily        03/05/21 0400    lidocaine (XYLOCAINE) 5 % ointment  As needed        03/05/21 0400    cephALEXin (KEFLEX) 500 MG capsule  2 times daily        03/05/21 0447    fluconazole (DIFLUCAN) 150 MG tablet   Once        03/05/21 0447            *Please note:  Arneda Sappington was evaluated in Emergency Department on 03/05/2021 for the symptoms described in the history of present illness. She was evaluated in the context of the global COVID-19 pandemic, which necessitated consideration that the patient might be at risk for infection with the SARS-CoV-2 virus that causes COVID-19. Institutional protocols and algorithms that pertain to the evaluation of patients at risk for COVID-19 are in a state of rapid change based on information released by regulatory bodies including the CDC and federal and state organizations. These policies and algorithms were followed during the patient's  care in the ED.  Some ED evaluations and interventions may be delayed as a result of limited staffing during and the pandemic.*   Note:  This document was prepared using Dragon voice recognition software and may include unintentional dictation errors.    Ashya Nicolaisen, Layla Maw, DO 03/05/21 [phone]

## 2021-03-05 NOTE — ED Triage Notes (Signed)
Pt to ED from home c/o lower mid abd pain and constipation for a couple days.  States last solid BM 2 days ago, tried laxatives at home without much relief. States some rectum pain.  Denies n/v.

## 2021-03-05 NOTE — ED Notes (Signed)
POC results is negative

## 2021-03-07 LAB — URINE CULTURE: Culture: 30000 — AB

## 2021-05-14 IMAGING — CR DG ABDOMEN 1V
1 series · 2 of 2 positions shown · non-contrast
Comparison: None.

CLINICAL DATA: Diffuse abdominal pain.

EXAM:
ABDOMEN - 1 VIEW

[Series 1: dg abd 1 view · 0.14mm/px · 2 of 2 slices shown]
[im 1/2]
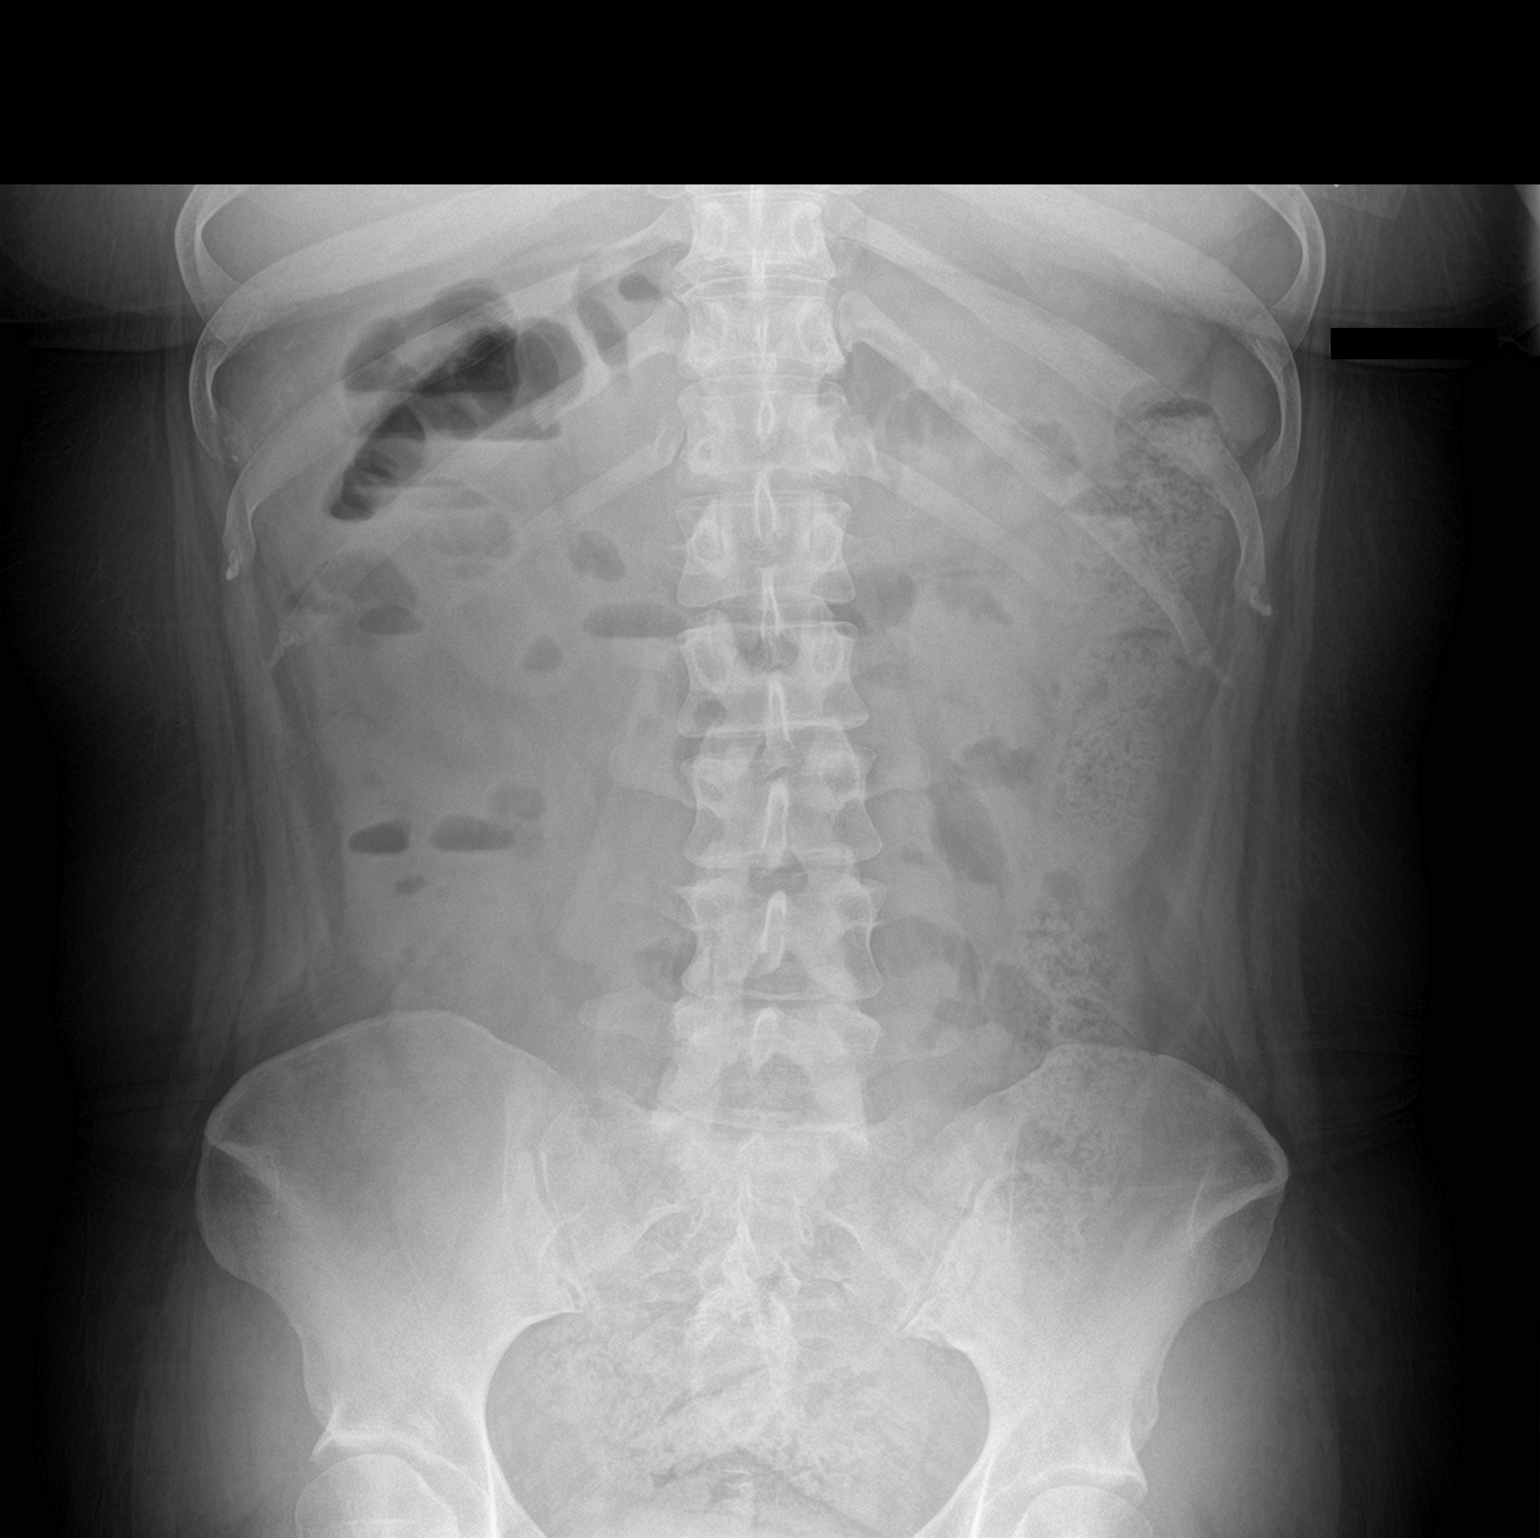
[im 2/2]
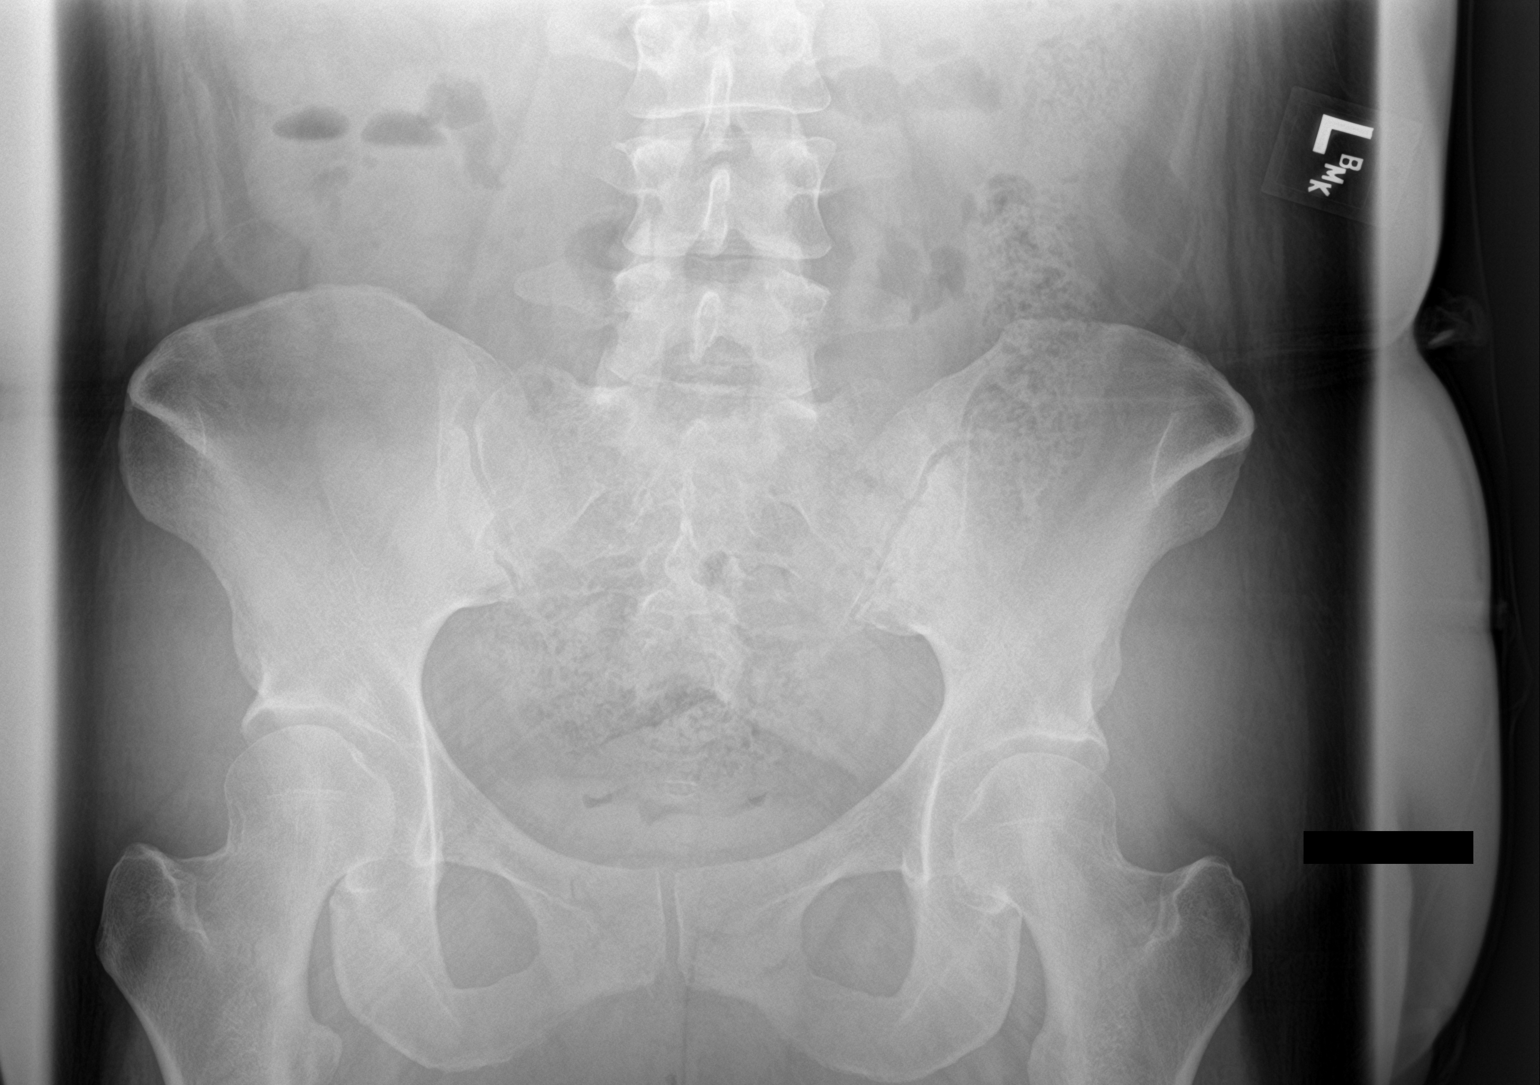

[2 of 2 positions shown; findings below may reference images not displayed]

FINDINGS: The bowel gas pattern is normal. A marked amount of stool is seen
within the descending and sigmoid colon. No radio-opaque calculi or
other significant radiographic abnormality are seen.
IMPRESSION: Negative.

## 2021-05-17 IMAGING — US US ABDOMEN LIMITED
1 series · 14 of 25 positions shown · non-contrast
Comparison: Abdominal ultrasound 07/25/2017

CLINICAL DATA: Right upper quadrant pain and epigastric pain
nausea.

EXAM:
ULTRASOUND ABDOMEN LIMITED RIGHT UPPER QUADRANT

[Series 1: us abdomen limited ruq · 14 of 53 slices shown]
[im 1/53]
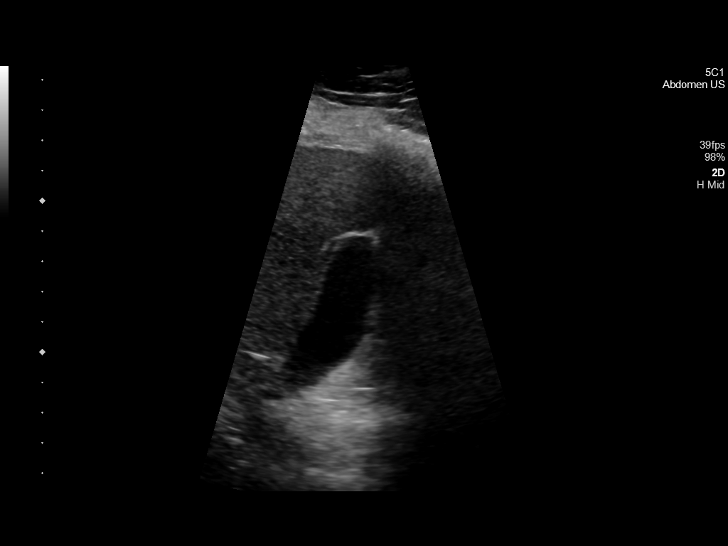
[im 5/53]
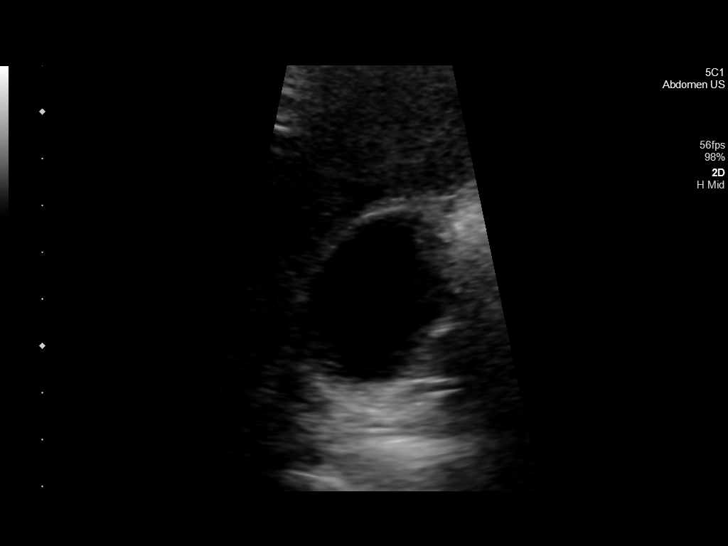
[im 9/53]
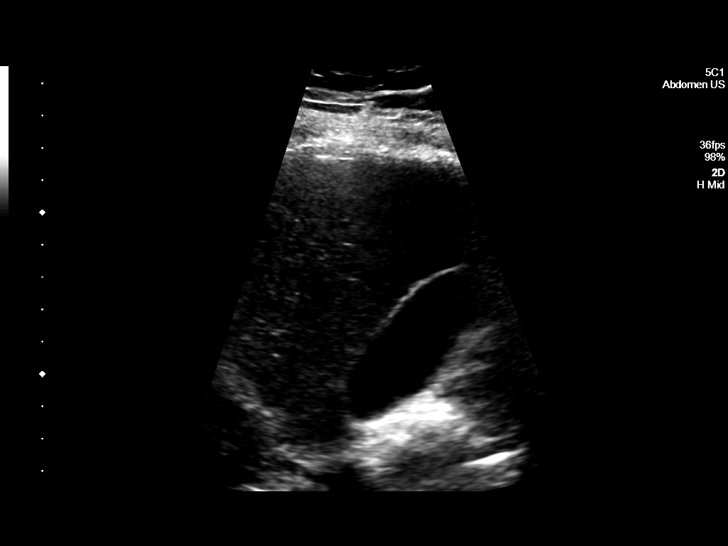
[im 14/53]
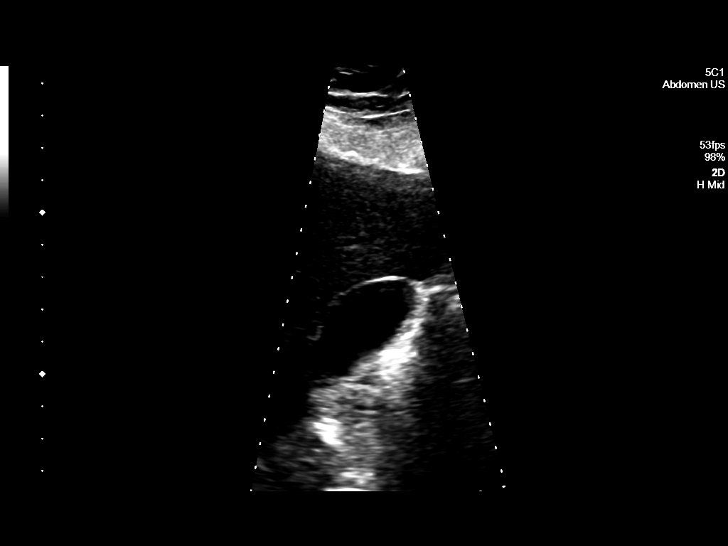
[im 18/53]
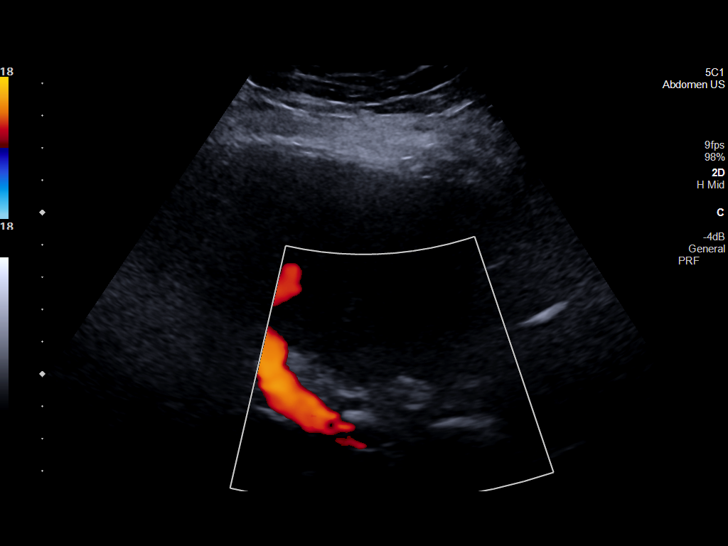
[im 20/53]
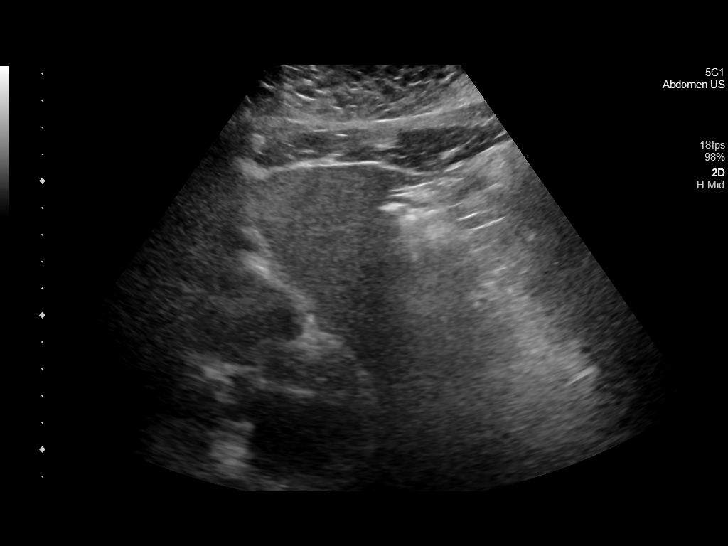
[im 24/53]
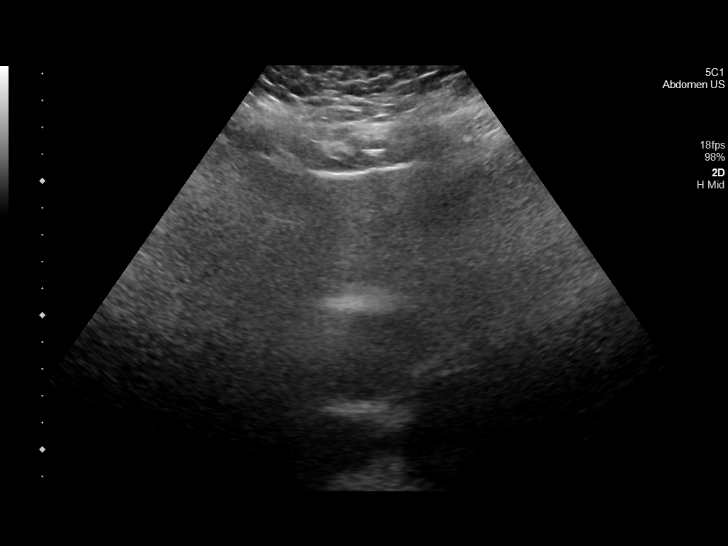
[im 29/53]
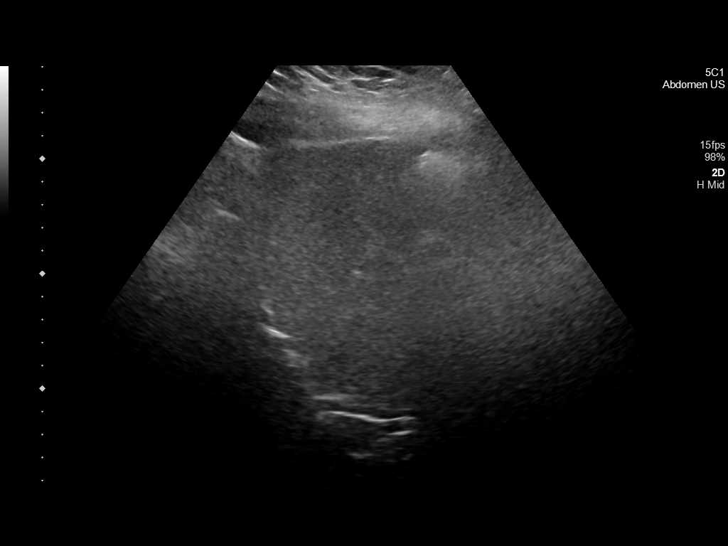
[im 33/53]
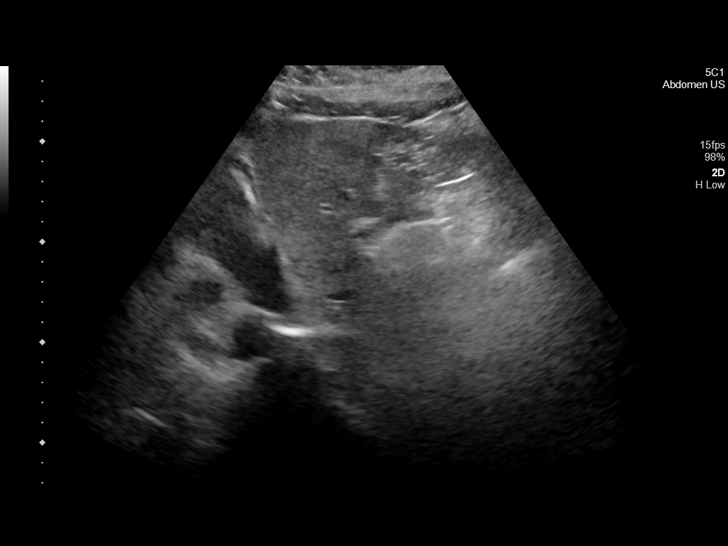
[im 35/53]
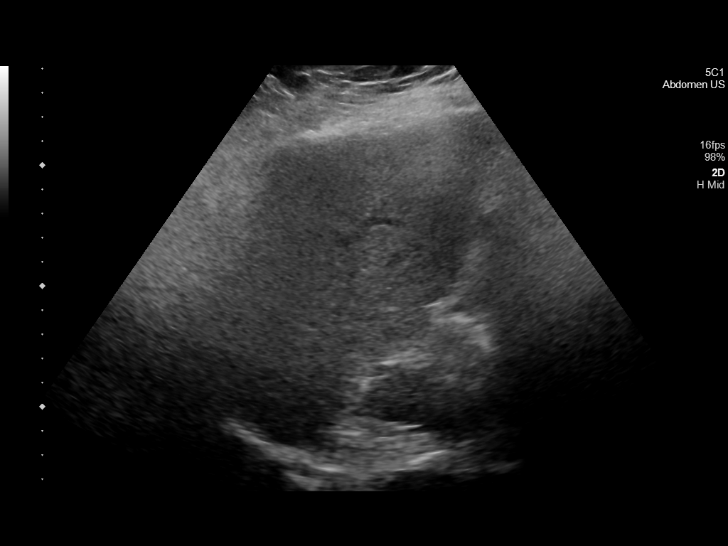
[im 40/53]
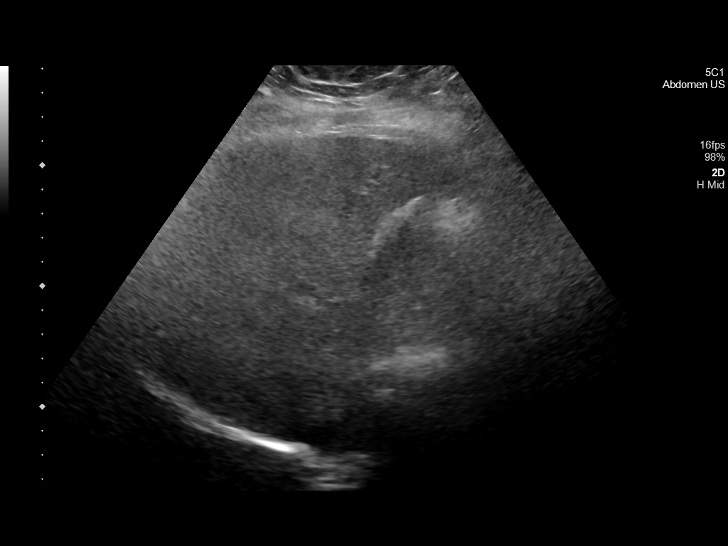
[im 44/53]
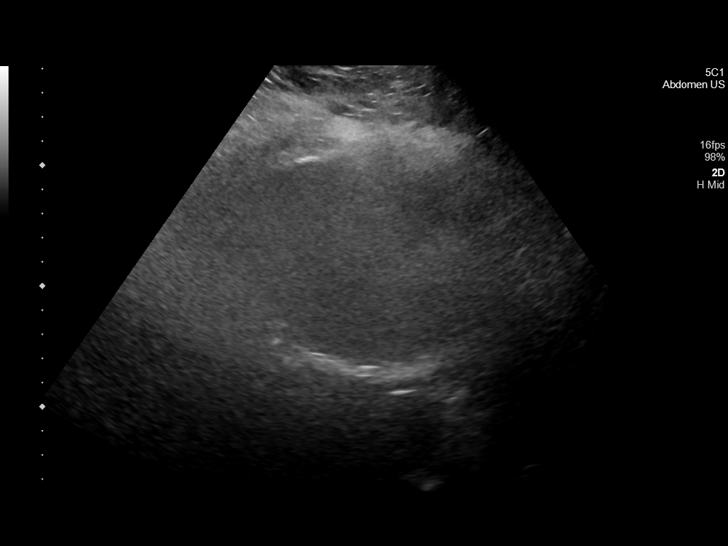
[im 48/53]
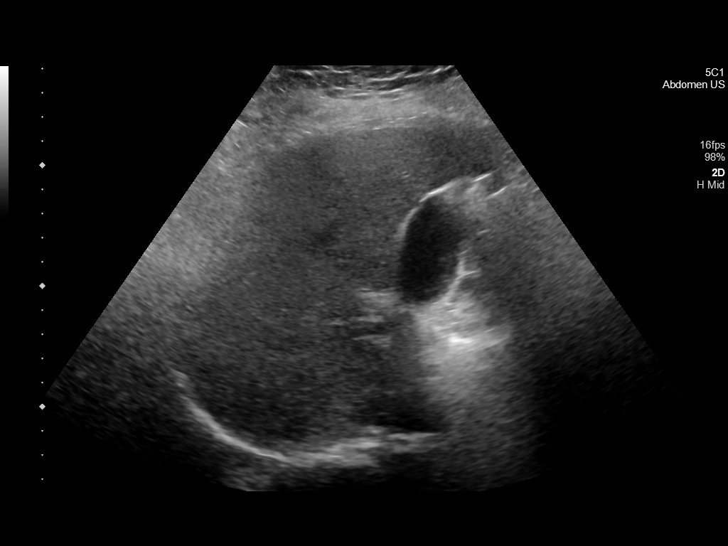
[im 53/53]
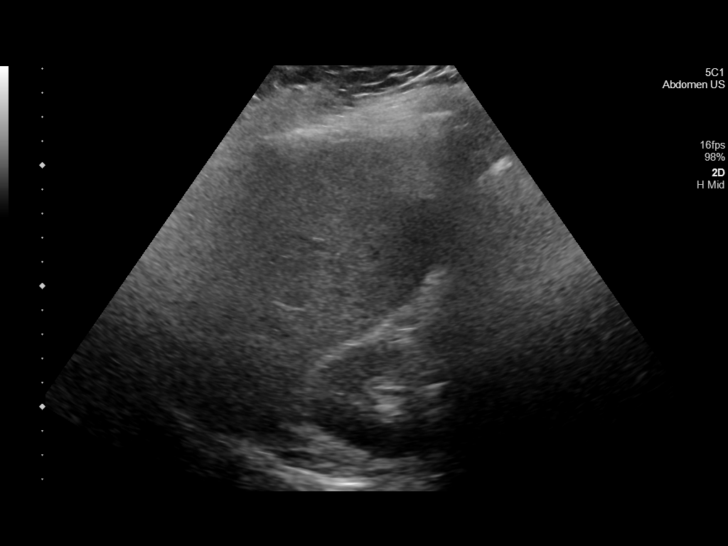

[14 of 25 positions shown; findings below may reference images not displayed]

FINDINGS: Gallbladder:

No gallstones or wall thickening visualized. No sonographic Murphy
sign noted by sonographer.

Common bile duct:

Diameter: 0.4 cm, within limits.

Liver:

No focal lesion identified. Diffusely increased parenchymal
echogenicity. Portal vein is patent on color Doppler imaging with
normal direction of blood flow towards the liver.

Other: None.
IMPRESSION: 1. No acute finding to explain the patient's right upper quadrant
pain.

2. Diffusely increased liver parenchymal echogenicity most commonly
seen with hepatic steatosis.

## 2021-09-14 DIAGNOSIS — I10 Essential (primary) hypertension: Secondary | ICD-10-CM | POA: Diagnosis present

## 2022-09-26 ENCOUNTER — Emergency Department
Admission: EM | Admit: 2022-09-26 | Discharge: 2022-09-26 | Disposition: A | Discharge status: Home / Self Care | Payer: Self-pay | Attending: Emergency Medicine | Admitting: Emergency Medicine

## 2022-09-26 DIAGNOSIS — I10 Essential (primary) hypertension: Secondary | ICD-10-CM | POA: Insufficient documentation

## 2022-09-26 DIAGNOSIS — K5641 Fecal impaction: Secondary | ICD-10-CM | POA: Insufficient documentation

## 2022-09-26 DIAGNOSIS — L304 Erythema intertrigo: Secondary | ICD-10-CM | POA: Insufficient documentation

## 2022-09-26 DIAGNOSIS — K5900 Constipation, unspecified: Secondary | ICD-10-CM

## 2022-09-26 DIAGNOSIS — E119 Type 2 diabetes mellitus without complications: Secondary | ICD-10-CM | POA: Insufficient documentation

## 2022-09-26 MED ORDER — LACTULOSE 10 GM/15ML PO SOLN
10.0000 g | Freq: Once | ORAL | Status: AC
  Administered 2022-09-26: 10 g via ORAL
  Filled 2022-09-26: qty 30

## 2022-09-26 MED ORDER — DOCUSATE SODIUM 100 MG PO CAPS: 100.0000 mg | ORAL_CAPSULE | Freq: Every day | ORAL | 0 refills | Status: DC | PRN

## 2022-09-26 MED ORDER — POLYETHYLENE GLYCOL 3350 17 G PO PACK: 8.0000 g | PACK | Freq: Every day | ORAL | 0 refills | Status: DC

## 2022-09-26 MED ORDER — FLEET ENEMA 7-19 GM/118ML RE ENEM
1.0000 | ENEMA | Freq: Once | RECTAL | Status: AC
  Administered 2022-09-26: 1 via RECTAL

## 2022-09-26 MED ORDER — ONDANSETRON HCL 4 MG PO TABS: 8.0000 mg | ORAL_TABLET | Freq: Once | ORAL | Status: DC

## 2022-09-26 MED ORDER — FLUCONAZOLE 100 MG PO TABS: 100.0000 mg | ORAL_TABLET | Freq: Every day | ORAL | 0 refills | Status: AC

## 2022-09-26 MED ORDER — ONDANSETRON 4 MG PO TBDP
4.0000 mg | ORAL_TABLET | Freq: Once | ORAL | Status: AC
  Administered 2022-09-26: 4 mg via ORAL
  Filled 2022-09-26: qty 1

## 2022-09-26 NOTE — Discharge Instructions (Addendum)
Being treated in the ER is only one step in your treatment and safety! Even if you feel better, you still need to go to all suggested follow-up appointments and take medications as directed. This will help you recover and prevent future problems. Make sure to make and go to all appointments, and call your doctor if things are not going as expected.  Take over-the-counter Tylenol or Ibuprofen with food or snacks as needed for pain. Use OTC antifungal powder/cream for affected area. Take care and be patient!

## 2022-09-26 NOTE — ED Provider Notes (Signed)
[facility] Emergency Department Provider Note     Event Date/Time   First MD Initiated Contact with Patient 09/26/22 1631     (approximate)   History   Constipation   HPI  Carolyn Andrews is a 32 y.o. female with history of non-insulin dependent diabetes, and hypertension presents emergency department for constipation for 3 days. Patient reports last bowel movement was 3 to 4 days ago.  Patient noticed pebble-like stools during her most recent bowel movement. She has tried MiraLAX, and a stool softener with no improvement.  Patient now reports feeling a large hard stool at her rectum. She endorses passing flatulence, nausea and hematochezia with wiping with 1 episode.   Associated symptoms include mild diffuse left sided abdominal pain. Patient denies fever, chills, vomiting, chest pain, shortness of breath.    Physical Exam   Triage Vital Signs: ED Triage Vitals  Enc Vitals Group     BP 09/26/22 1452 (!) 167/92     Pulse Rate 09/26/22 1452 99     Resp --      Temp 09/26/22 1452 97.6 F (36.4 C)     Temp Source 09/26/22 1452 Oral     SpO2 09/26/22 1452 99 %     Weight 09/26/22 1455 195 lb (88.5 kg)     Height 09/26/22 1455 5' (1.524 m)     Head Circumference --      Peak Flow --      Pain Score 09/26/22 1454 8     Pain Loc --      Pain Edu? --      Excl. in GC? --     Most recent vital signs: Vitals:   09/26/22 1452  BP: (!) 167/92  Pulse: 99  Temp: 97.6 F (36.4 C)  SpO2: 99%   General: Alert and oriented. INAD.  Comfortable Skin:  Warm, dry and intact. No rashes or lesions noted.     CV:  Good peripheral perfusion. RRR.  RESP:  Normal effort. LCTAB. No retractions.  ABD:  No distention. NBSx4. Soft. Tenderness to palpation, LQ > RQ. no CVA tenderness.  MSK:   Patient freely moves all extremities. No warm swollen joints. No peripheral edema.  Other:  Upon opening buttocks to perform fecal disimpaction, there is prominent  skin breakdown around rectum.     ED Results / Procedures / Treatments   Labs (all labs ordered are listed, but only abnormal results are displayed) Labs Reviewed  POC URINE PREG, ED   Patient denied to provide urine for pregnancy test.  She reports that there is no way for her to be pregnant because she is currently on her menstrual cycle.  PROCEDURES:  Critical Care performed: No  Fecal disimpaction  Date/Time: 09/26/2022 8:33 PM  Performed by: Conrad [place], PA-C Authorized by: Conrad Luck, PA-C  Consent: Verbal consent obtained. Consent given by: patient Patient understanding: patient states understanding of the procedure being performed Patient consent: the patient's understanding of the procedure matches consent given Patient tolerance: patient tolerated the procedure well with no immediate complications   MEDICATIONS ORDERED IN ED: Medications  ondansetron (ZOFRAN-ODT) disintegrating tablet 4 mg (4 mg Oral Given 09/26/22 1816)  lactulose (CHRONULAC) 10 GM/15ML solution 10 g (10 g Oral Given by Other 09/26/22 1845)  sodium phosphate (FLEET) 7-19 GM/118ML enema 1 enema (1 enema Rectal Given 09/26/22 1840)    IMPRESSION / MDM / ASSESSMENT AND PLAN / ED COURSE  I reviewed the  triage vital signs and the nursing notes.                              Differential diagnosis includes, but is not limited to, constipation, fecal impaction, appendicitis, bowel obstruction, ectopic pregnancy.  Patient's presentation is most consistent with acute presentation with potential threat to life or bodily function.  Assessment:  32 year old female presents to the ED with complaint of not being able to move bowel for the past 3 days.  She took MiraLAX and magnesium citrate at home with no improvement in bowel movement.  (description). Vital signs are stable with the exception of mild ***. Initial assessment suggests a potential (acute organ system of complaint) requiring urgent  evaluation and intervention.   Differential diagnosis includes, but is not limited to, ***  Diagnostic Workup: (What was ordered in ED. Why it was ordered)  Given the patients history of *** the decision was made to obtain a *** to evaluate for possible or other *** pathology.   Interpretation of Results:  CT scan shows no evidence of acute findings of *** or CT unremarkable Laboratory studies including *** were WNL with the exception of elevated *** consistent with.   Treatment Plan in ED:(Ex: Fluids initiated)  (Consultations obtained/requested for further management..consultation recommendation pending)    The patients pain improved ***after receiving *** and completely resolved/did not completley resolve.  Given the overall well-appearance of the patient and essentially normal vital signs, *** (her ability to tolerate oral intact without difficulty, and {****her/his****} exam being consistent with***. I have a very low suspicion of {**her/him**} having a serious ***. I feel {**she/he**} is appropriate for discharge and outpatient follow up without additional evaluation.  Patient's diagnosis is consistent with ***. Patient will be discharged home with prescriptions for ***. Patient is to follow up with *** as needed or otherwise directed. Patient is given ED precautions to return to the ED for any worsening or new symptoms.  Patient refused to provide pregnancy test.  Explained to her the importance of obtaining this test to help rule out other potential diagnoses.  Patient verbally agrees and understands.  Patient's diagnosis is consistent with ***. Patient will be discharged home with prescriptions for ***. Patient is to follow up with *** as needed or otherwise directed. Patient is given ED precautions to return to the ED for any worsening or new symptoms.     FINAL CLINICAL IMPRESSION(S) / ED DIAGNOSES   Final diagnoses:  Constipation, unspecified constipation type  Fecal  impaction in rectum (HCC)  Intertrigo     Rx / DC Orders   ED Discharge Orders          Ordered    polyethylene glycol (MIRALAX) 17 g packet  Daily        09/26/22 1920    docusate sodium (COLACE) 100 MG capsule  Daily PRN        09/26/22 1920    fluconazole (DIFLUCAN) 100 MG tablet  Daily        09/26/22 1920             Note:  This document was prepared using Dragon voice recognition software and may include unintentional dictation errors.

## 2022-09-26 NOTE — ED Notes (Signed)
Pt not in room at this time

## 2022-09-26 NOTE — ED Triage Notes (Signed)
Pt reports no bowel movement for the past 3 days. Pt took mag citrate and stool softener without success. Pt reports abdominal pressure and pain is worse. Pt denies nausea or vomiting.

## 2023-04-25 ENCOUNTER — Ambulatory Visit (INDEPENDENT_AMBULATORY_CARE_PROVIDER_SITE_OTHER): Payer: Self-pay | Admitting: Gastroenterology

## 2023-04-25 ENCOUNTER — Encounter: Payer: Self-pay | Admitting: Gastroenterology

## 2023-04-25 VITALS — BP 188/112 | HR 86 | Temp 98.3°F | Ht 61.0 in | Wt 196.0 lb

## 2023-04-25 DIAGNOSIS — R194 Change in bowel habit: Secondary | ICD-10-CM

## 2023-04-25 DIAGNOSIS — K5900 Constipation, unspecified: Secondary | ICD-10-CM

## 2023-04-25 MED ORDER — LINACLOTIDE 145 MCG PO CAPS: 145.0000 ug | ORAL_CAPSULE | Freq: Every day | ORAL | Status: DC

## 2023-04-25 MED ORDER — NA SULFATE-K SULFATE-MG SULF 17.5-3.13-1.6 GM/177ML PO SOLN: 1.0000 | Freq: Once | ORAL | 0 refills | Status: AC

## 2023-04-25 NOTE — Addendum Note (Signed)
Addended by: Roena Malady on: 04/25/2023 01:37 PM   Modules accepted: Orders

## 2023-04-25 NOTE — Progress Notes (Signed)
Gastroenterology Consultation  Referring Provider:     Inc, [facility] Primary Care Physician:  Inc, [facility] Primary Gastroenterologist:  Dr. Servando Snare     Reason for Consultation:     Constipation        HPI:   Carolyn Andrews is a 32 y.o. y/o female referred for consultation & management of constipation by Dr. Theodoro Doing, [facility].  This patient comes in today with a history of constipation with multiple ER visits dating back to 2019.  The patient has been seen multiple times at the [facility] ED and also at the [facility] ED for these issues.  The patient has been treated with Colace MiraLAX and Senokot in the past.  Despite this she continues to have visits to the various emergency departments with abdominal pain and fecal impaction. The patient reports that she has been having worsening of her constipation and does not take anything on a regular basis.  She also reports that she has rectal bleeding that she thinks may be worse when she is having her constipation and has to push and strain.  The patient states that when she goes to the emergency department a usually do manual disimpaction.  She denies any unexplained weight loss or any family history of colon cancer or colon polyps.  She was told by her PCP that her diet may be causing some of her problems.  The patient has tried to change her diet and avoid the foods that she was told could be the issue but does not notice any change in her bowel movements.  Past Medical History:  Diagnosis Date   Diabetes (HCC)     No past surgical history on file.  Prior to Admission medications   Medication Sig Start Date End Date Taking? Authorizing Provider  cephALEXin (KEFLEX) 500 MG capsule Take 1 capsule (500 mg total) by mouth 2 (two) times daily. 03/05/21   Ward, Layla Maw, DO  docusate sodium (COLACE) 100 MG capsule Take 1 capsule (100 mg total) by mouth daily as needed. 09/26/22 09/26/23   Romeo Apple, Myah A, PA-C  FLUoxetine (PROZAC) 20 MG capsule Take 20 mg by mouth daily.    [provider]  glipiZIDE (GLUCOTROL) 10 MG tablet Take 10 mg by mouth daily before breakfast.    [provider]  lidocaine (XYLOCAINE) 5 % ointment Apply 1 application topically as needed. 03/05/21   Ward, Layla Maw, DO  metFORMIN (GLUCOPHAGE) 1000 MG tablet Take 1 tablet (1,000 mg total) by mouth 2 (two) times daily with a meal. 03/05/21 03/05/22  Ward, Layla Maw, DO  neomycin-polymyxin-pramoxine (NEOSPORIN PLUS) 1 % cream Apply topically 2 (two) times daily. 11/29/15   Joni Reining, PA-C  ondansetron (ZOFRAN ODT) 4 MG disintegrating tablet Take 1 tablet (4 mg total) by mouth every 8 (eight) hours as needed for nausea or vomiting. 03/05/21   Ward, Layla Maw, DO  pantoprazole (PROTONIX) 20 MG tablet Take 1 tablet (20 mg total) by mouth daily. 02/11/20 02/10/21  Jene Every, MD  polyethylene glycol (MIRALAX) 17 g packet Take 8 g by mouth daily. 09/26/22   Romeo Apple, Myah A, PA-C  sucralfate (CARAFATE) 1 g tablet Take 1 tablet (1 g total) by mouth 4 (four) times daily -  with meals and at bedtime. 02/11/20   Jene Every, MD  sulfamethoxazole-trimethoprim (BACTRIM DS) 800-160 MG tablet Take 1 tablet by mouth 2 (two) times daily. 12/08/18   Enid Derry, PA-C  Family History  Problem Relation Age of Onset   Diabetes Mother      Social History   Tobacco Use   Smoking status: Never   Smokeless tobacco: Never  Substance Use Topics   Alcohol use: No   Drug use: Never    Allergies as of 04/25/2023   (No Known Allergies)    Review of Systems:    All systems reviewed and negative except where noted in HPI.   Physical Exam:  There were no vitals taken for this visit. No LMP recorded. (Menstrual status: Irregular Periods). General:   Alert,  Well-developed, well-nourished, pleasant and cooperative in NAD Head:  Normocephalic and atraumatic. Eyes:  Sclera clear, no icterus.    Conjunctiva pink. Ears:  Normal auditory acuity. Neck:  Supple; no masses or thyromegaly. Lungs:  Respirations even and unlabored.  Clear throughout to auscultation.   No wheezes, crackles, or rhonchi. No acute distress. Heart:  Regular rate and rhythm; no murmurs, clicks, rubs, or gallops. Abdomen:  Normal bowel sounds.  No bruits.  Soft, non-tender and non-distended without masses, hepatosplenomegaly or hernias noted.  No guarding or rebound tenderness.  Negative Carnett sign.   Rectal:  Deferred.  Pulses:  Normal pulses noted. Extremities:  No clubbing or edema.  No cyanosis. Neurologic:  Alert and oriented x3;  grossly normal neurologically. Skin:  Intact without significant lesions or rashes.  No jaundice. Lymph Nodes:  No significant cervical adenopathy. Psych:  Alert and cooperative. Normal mood and affect.  Imaging Studies: No results found.  Assessment and Plan:   Carolyn Andrews is a 32 y.o. y/o female who comes in today with a change in bowel habits although she has been constipated for many years she states that it is getting worse.  She also has abdominal pain with this and likely has irritable bowel syndrome with constipation predominance.  Despite this being the most likely diagnosis the patient has had some rectal bleeding and symptoms have worsened recently without any life events to explain the change.  She states that before this started around 2017 she was having normal bowel movements without any problems.  The patient will be set up for colonoscopy due to change in bowel habits.  The patient has been explained the plan and agrees with it.    Midge Minium, MD. Clementeen Graham    Note: This dictation was prepared with Dragon dictation along with smaller phrase technology. Any transcriptional errors that result from this process are unintentional.

## 2023-04-27 ENCOUNTER — Encounter: Payer: Self-pay | Admitting: Gastroenterology

## 2023-05-03 ENCOUNTER — Encounter: Payer: Self-pay | Admitting: Gastroenterology

## 2023-05-04 ENCOUNTER — Ambulatory Visit
Admission: RE | Admit: 2023-05-04 | Discharge: 2023-05-04 | Disposition: A | Discharge status: Home / Self Care | Payer: Self-pay | Attending: Gastroenterology | Admitting: Gastroenterology

## 2023-05-04 ENCOUNTER — Encounter
Admission: RE | Disposition: A | Discharge status: Home / Self Care | Payer: Self-pay | Source: Home / Self Care | Attending: Gastroenterology

## 2023-05-04 ENCOUNTER — Encounter: Payer: Self-pay | Admitting: Gastroenterology

## 2023-05-04 ENCOUNTER — Ambulatory Visit: Payer: Self-pay | Admitting: Certified Registered"

## 2023-05-04 DIAGNOSIS — Z7984 Long term (current) use of oral hypoglycemic drugs: Secondary | ICD-10-CM | POA: Insufficient documentation

## 2023-05-04 DIAGNOSIS — Z794 Long term (current) use of insulin: Secondary | ICD-10-CM | POA: Insufficient documentation

## 2023-05-04 DIAGNOSIS — R194 Change in bowel habit: Secondary | ICD-10-CM

## 2023-05-04 DIAGNOSIS — E109 Type 1 diabetes mellitus without complications: Secondary | ICD-10-CM | POA: Insufficient documentation

## 2023-05-04 HISTORY — PX: COLONOSCOPY WITH PROPOFOL: SHX5780

## 2023-05-04 LAB — GLUCOSE, CAPILLARY
Glucose-Capillary: 361 mg/dL — ABNORMAL HIGH (ref 70–99)
Glucose-Capillary: 371 mg/dL — ABNORMAL HIGH (ref 70–99)

## 2023-05-04 LAB — POCT PREGNANCY, URINE: Preg Test, Ur: NEGATIVE

## 2023-05-04 SURGERY — COLONOSCOPY WITH PROPOFOL
Anesthesia: General

## 2023-05-04 MED ORDER — PROPOFOL 10 MG/ML IV BOLUS
INTRAVENOUS | Status: DC | PRN
  Administered 2023-05-04: 20 mg via INTRAVENOUS
  Administered 2023-05-04: 30 mg via INTRAVENOUS
  Administered 2023-05-04: 70 mg via INTRAVENOUS

## 2023-05-04 MED ORDER — INSULIN ASPART 100 UNIT/ML IJ SOLN
INTRAMUSCULAR | Status: AC
  Filled 2023-05-04: qty 1

## 2023-05-04 MED ORDER — INSULIN ASPART 100 UNIT/ML IJ SOLN
7.0000 [IU] | Freq: Once | INTRAMUSCULAR | Status: AC
  Administered 2023-05-04: 7 [IU] via SUBCUTANEOUS

## 2023-05-04 MED ORDER — SODIUM CHLORIDE 0.9 % IV SOLN: INTRAVENOUS | Status: DC

## 2023-05-04 MED ORDER — LIDOCAINE HCL (CARDIAC) PF 100 MG/5ML IV SOSY
PREFILLED_SYRINGE | INTRAVENOUS | Status: DC | PRN
  Administered 2023-05-04: 50 mg via INTRAVENOUS

## 2023-05-04 MED ORDER — PROPOFOL 500 MG/50ML IV EMUL
INTRAVENOUS | Status: DC | PRN
  Administered 2023-05-04: 150 ug/kg/min via INTRAVENOUS

## 2023-05-04 NOTE — Progress Notes (Signed)
Blood sugar remains elevated after receiving 7 units of insulin pre procedure. Anesthesiologist has been notified. An order to administer 7 additional units of insulin and then [name] can resume her usual diabetes medications at home.

## 2023-05-04 NOTE — H&P (Signed)
Midge Minium, MD [facility] [address] [place], [place] [address] Phone:[phone] Fax : [phone]  Primary Care Physician:  Inc, [facility] Primary Gastroenterologist:  Dr. Servando Snare  Pre-Procedure History & Physical: HPI:  Carolyn Andrews is a 32 y.o. female is here for an colonoscopy.   Past Medical History:  Diagnosis Date   Diabetes (HCC)     History reviewed. No pertinent surgical history.  Prior to Admission medications   Medication Sig Start Date End Date Taking? Authorizing Provider  amLODipine (NORVASC) 10 MG tablet Take 10 mg by mouth daily. 04/06/23  Yes [provider]  HUMALOG KWIKPEN 100 UNIT/ML KwikPen Inject 5 Units into the skin 3 (three) times daily. 04/06/23   [provider]  lactulose (CHRONULAC) 10 GM/15ML solution SMARTSIG:Milliliter(s) By Mouth 04/08/23   [provider]  linaclotide Karlene Einstein) 145 MCG CAPS capsule Take 1 capsule (145 mcg total) by mouth daily before breakfast. 04/25/23   Midge Minium, MD  metFORMIN (GLUCOPHAGE) 500 MG tablet Take 500 mg by mouth 2 (two) times daily. 11/15/22   [provider]  rosuvastatin (CRESTOR) 10 MG tablet Take 10 mg by mouth at bedtime. 11/15/22   [provider]  senna-docusate (SENOKOT-S) 8.6-50 MG tablet Take 2 tablets by mouth daily. 03/23/23   [provider]    Allergies as of 04/25/2023   (No Known Allergies)    Family History  Problem Relation Age of Onset   Diabetes Mother     Social History   Socioeconomic History   Marital status: Single    Spouse name: Not on file   Number of children: Not on file   Years of education: Not on file   Highest education level: Not on file  Occupational History   Not on file  Tobacco Use   Smoking status: Never   Smokeless tobacco: Never  Vaping Use   Vaping status: Never Used  Substance and Sexual Activity   Alcohol use: No   Drug use: Never   Sexual activity: Not on file   Other Topics Concern   Not on file  Social History Narrative   Not on file   Social Determinants of Health   Financial Resource Strain: Not on file  Food Insecurity: Not on file  Transportation Needs: Not on file  Physical Activity: Not on file  Stress: Not on file  Social Connections: Not on file  Intimate Partner Violence: Not At Risk (06/05/2021)   Received from [facility], [facility]   Humiliation, Afraid, Rape, and Kick questionnaire    Fear of Current or Ex-Partner: No    Emotionally Abused: No    Physically Abused: No    Sexually Abused: No    Review of Systems: See HPI, otherwise negative ROS  Physical Exam: BP (!) 155/83   Pulse 89   Temp (!) 97.1 F (36.2 C) (Temporal)   Resp 18   Ht 5\' 1"  (1.549 m)   Wt 86.5 kg   LMP 03/26/2023 Comment: negative  SpO2 99%   BMI 36.01 kg/m  General:   Alert,  pleasant and cooperative in NAD Head:  Normocephalic and atraumatic. Neck:  Supple; no masses or thyromegaly. Lungs:  Clear throughout to auscultation.    Heart:  Regular rate and rhythm. Abdomen:  Soft, nontender and nondistended. Normal bowel sounds, without guarding, and without rebound.   Neurologic:  Alert and  oriented x4;  grossly normal neurologically.  Impression/Plan: Carolyn Andrews is here for  an colonoscopy to be performed for change in bowel habits  Risks, benefits, limitations, and alternatives regarding  colonoscopy have been reviewed with the patient.  Questions have been answered.  All parties agreeable.   Midge Minium, MD  05/04/2023, 9:49 AM

## 2023-05-04 NOTE — Anesthesia Procedure Notes (Signed)
Procedure Name: MAC Date/Time: 05/04/2023 10:24 AM  Performed by: Cheral Bay, CRNAPre-anesthesia Checklist: Patient identified, Emergency Drugs available, Suction available, Patient being monitored and Timeout performed Patient Re-evaluated:Patient Re-evaluated prior to induction Oxygen Delivery Method: Nasal cannula Induction Type: IV induction Placement Confirmation: positive ETCO2 and CO2 detector

## 2023-05-04 NOTE — Anesthesia Preprocedure Evaluation (Signed)
Anesthesia Evaluation  Patient identified by MRN, date of birth, ID band Patient awake    Reviewed: Allergy & Precautions, H&P , NPO status , Patient's Chart, lab work & pertinent test results, reviewed documented beta blocker date and time   Airway Mallampati: II   Neck ROM: full    Dental  (+) Poor Dentition   Pulmonary neg pulmonary ROS   Pulmonary exam normal        Cardiovascular Exercise Tolerance: Good negative cardio ROS Normal cardiovascular exam Rhythm:regular Rate:Normal     Neuro/Psych negative neurological ROS  negative psych ROS   GI/Hepatic negative GI ROS, Neg liver ROS,,,  Endo/Other  negative endocrine ROSdiabetes, Poorly Controlled, Type 1, Insulin Dependent    Renal/GU negative Renal ROS  negative genitourinary   Musculoskeletal   Abdominal   Peds  Hematology negative hematology ROS (+)   Anesthesia Other Findings Past Medical History: No date: Diabetes (HCC) History reviewed. No pertinent surgical history. BMI    Body Mass Index: 36.01 kg/m     Reproductive/Obstetrics negative OB ROS                             Anesthesia Physical Anesthesia Plan  ASA: 3  Anesthesia Plan: General   Post-op Pain Management:    Induction:   PONV Risk Score and Plan:   Airway Management Planned:   Additional Equipment:   Intra-op Plan:   Post-operative Plan:   Informed Consent: I have reviewed the patients History and Physical, chart, labs and discussed the procedure including the risks, benefits and alternatives for the proposed anesthesia with the patient or authorized representative who has indicated his/her understanding and acceptance.     Dental Advisory Given  Plan Discussed with: CRNA  Anesthesia Plan Comments:        Anesthesia Quick Evaluation

## 2023-05-04 NOTE — Transfer of Care (Signed)
Immediate Anesthesia Transfer of Care Note  Patient: Carolyn Andrews [facility]  Procedure(s) Performed: COLONOSCOPY WITH PROPOFOL  Patient Location: PACU and Endoscopy Unit  Anesthesia Type:General  Level of Consciousness: awake  Airway & Oxygen Therapy: Patient Spontanous Breathing  Post-op Assessment: Report given to RN and Post -op Vital signs reviewed and stable  Post vital signs: Reviewed and stable  Last Vitals:  Vitals Value Taken Time  BP    Temp    Pulse 83 05/04/23 1040  Resp 24 05/04/23 1040  SpO2 100 % 05/04/23 1040  Vitals shown include unfiled device data.  Last Pain:  Vitals:   05/04/23 0938  TempSrc: Temporal  PainSc: 0-No pain         Complications: No notable events documented.

## 2023-05-04 NOTE — Op Note (Signed)
[facility] Gastroenterology Patient Name: Carolyn Andrews [facility] Procedure Date: 05/04/2023 9:44 AM MRN: [id number] Account #: [id number] Date of Birth: Oct 22, 1990 Admit Type: Outpatient Age: 32 Room: [facility] ENDO ROOM 4 Gender: Female Note Status: Finalized Instrument Name: Nelda Marseille [id number] Procedure:             Colonoscopy Indications:           Change in bowel habits Providers:             Midge Minium MD, MD Referring MD:          Midge Minium MD, MD (Referring MD) Medicines:             Propofol per Anesthesia Complications:         No immediate complications. Procedure:             Pre-Anesthesia Assessment:                        - Prior to the procedure, a History and Physical was                         performed, and patient medications and allergies were                         reviewed. The patient's tolerance of previous                         anesthesia was also reviewed. The risks and benefits                         of the procedure and the sedation options and risks                         were discussed with the patient. All questions were                         answered, and informed consent was obtained. Prior                         Anticoagulants: The patient has taken no anticoagulant                         or antiplatelet agents. ASA Grade Assessment: II - A                         patient with mild systemic disease. After reviewing                         the risks and benefits, the patient was deemed in                         satisfactory condition to undergo the procedure.                        After obtaining informed consent, the colonoscope was                         passed under direct vision. Throughout the procedure,  the patient's blood pressure, pulse, and oxygen                         saturations were monitored continuously. The                         Colonoscope was introduced through the anus  and                         advanced to the the cecum, identified by appendiceal                         orifice and ileocecal valve. The colonoscopy was                         performed without difficulty. The patient tolerated                         the procedure well. The quality of the bowel                         preparation was adequate to identify polyps greater                         than 5 mm in size. Findings:      The perianal and digital rectal examinations were normal.      The colon (entire examined portion) appeared normal. Impression:            - The entire examined colon is normal.                        - No specimens collected. Recommendation:        - Discharge patient to home.                        - High fiber diet.                        - Continue present medications. Procedure Code(s):     --- Professional ---                        [phone], Colonoscopy, flexible; diagnostic, including                         collection of specimen(s) by brushing or washing, when                         performed (separate procedure) Diagnosis Code(s):     --- Professional ---                        R19.4, Change in bowel habit CPT copyright 2022 American Medical Association. All rights reserved. The codes documented in this report are preliminary and upon coder review may  be revised to meet current compliance requirements. Midge Minium MD, MD 05/04/2023 10:39:58 AM This report has been signed electronically. Number of Addenda: 0 Note Initiated On: 05/04/2023 9:44 AM Scope Withdrawal Time: 0 hours 6 minutes 32 seconds  Total Procedure Duration: 0 hours 9 minutes 34 seconds  Estimated Blood  Loss:  Estimated blood loss: none.      [facility]

## 2023-05-04 NOTE — Anesthesia Postprocedure Evaluation (Signed)
Anesthesia Post Note  Patient: Carolyn Andrews [facility]  Procedure(s) Performed: COLONOSCOPY WITH PROPOFOL  Patient location during evaluation: PACU Anesthesia Type: General Level of consciousness: awake and alert Pain management: pain level controlled Vital Signs Assessment: post-procedure vital signs reviewed and stable Respiratory status: spontaneous breathing, nonlabored ventilation, respiratory function stable and patient connected to nasal cannula oxygen Cardiovascular status: blood pressure returned to baseline and stable Postop Assessment: no apparent nausea or vomiting Anesthetic complications: no   No notable events documented.   Last Vitals:  Vitals:   05/04/23 0938 05/04/23 1039  BP: (!) 155/83 (!) 100/53  Pulse: 89 85  Resp: 18 (!) 23  Temp: (!) 36.2 C (!) 36.1 C  SpO2: 99% 100%    Last Pain:  Vitals:   05/04/23 1059  TempSrc:   PainSc: 0-No pain                 Yevette Edwards

## 2023-05-05 ENCOUNTER — Encounter: Payer: Self-pay | Admitting: Gastroenterology

## 2023-05-24 ENCOUNTER — Telehealth: Payer: Self-pay

## 2023-05-24 MED ORDER — LINACLOTIDE 290 MCG PO CAPS: 290.0000 ug | ORAL_CAPSULE | Freq: Every day | ORAL | 1 refills | Status: AC

## 2023-05-24 NOTE — Telephone Encounter (Signed)
Pt call back and can not find the medication that was given to her on 04-25-2023. Pt ask that MD or CMA to call her back

## 2023-05-24 NOTE — Addendum Note (Signed)
 Addended by: Roena Malady on: 05/24/2023 09:57 AM   Modules accepted: Orders

## 2023-05-24 NOTE — Telephone Encounter (Signed)
 Pt states she received a sample of a medication while in office 04-25-2023. Pt not sure what it was called but she would like to have a higher dosages due to this one not working . Per pt will call me back after 1 once she gets home to let me know what the medication and dosage were.

## 2023-09-24 DIAGNOSIS — N3000 Acute cystitis without hematuria: Secondary | ICD-10-CM | POA: Diagnosis not present

## 2023-09-24 DIAGNOSIS — E119 Type 2 diabetes mellitus without complications: Secondary | ICD-10-CM | POA: Diagnosis not present

## 2023-09-24 DIAGNOSIS — R109 Unspecified abdominal pain: Secondary | ICD-10-CM | POA: Diagnosis not present

## 2023-09-24 DIAGNOSIS — Z7984 Long term (current) use of oral hypoglycemic drugs: Secondary | ICD-10-CM | POA: Diagnosis not present

## 2023-09-24 DIAGNOSIS — Z79899 Other long term (current) drug therapy: Secondary | ICD-10-CM | POA: Diagnosis not present

## 2023-09-24 DIAGNOSIS — I10 Essential (primary) hypertension: Secondary | ICD-10-CM | POA: Diagnosis not present

## 2023-09-24 DIAGNOSIS — M7989 Other specified soft tissue disorders: Secondary | ICD-10-CM | POA: Diagnosis not present

## 2023-09-24 DIAGNOSIS — Z0389 Encounter for observation for other suspected diseases and conditions ruled out: Secondary | ICD-10-CM | POA: Diagnosis not present

## 2023-09-24 DIAGNOSIS — E669 Obesity, unspecified: Secondary | ICD-10-CM | POA: Diagnosis not present

## 2023-09-24 DIAGNOSIS — R600 Localized edema: Secondary | ICD-10-CM | POA: Diagnosis not present

## 2023-09-24 DIAGNOSIS — F419 Anxiety disorder, unspecified: Secondary | ICD-10-CM | POA: Diagnosis not present

## 2023-09-24 DIAGNOSIS — M79604 Pain in right leg: Secondary | ICD-10-CM | POA: Diagnosis not present

## 2023-09-27 ENCOUNTER — Other Ambulatory Visit: Payer: Self-pay

## 2023-09-27 ENCOUNTER — Emergency Department

## 2023-09-27 ENCOUNTER — Encounter: Payer: Self-pay | Admitting: *Deleted

## 2023-09-27 DIAGNOSIS — R059 Cough, unspecified: Secondary | ICD-10-CM | POA: Diagnosis not present

## 2023-09-27 DIAGNOSIS — J101 Influenza due to other identified influenza virus with other respiratory manifestations: Secondary | ICD-10-CM | POA: Insufficient documentation

## 2023-09-27 DIAGNOSIS — Z5321 Procedure and treatment not carried out due to patient leaving prior to being seen by health care provider: Secondary | ICD-10-CM | POA: Insufficient documentation

## 2023-09-27 DIAGNOSIS — R079 Chest pain, unspecified: Secondary | ICD-10-CM | POA: Diagnosis not present

## 2023-09-27 DIAGNOSIS — J984 Other disorders of lung: Secondary | ICD-10-CM | POA: Diagnosis not present

## 2023-09-27 DIAGNOSIS — R918 Other nonspecific abnormal finding of lung field: Secondary | ICD-10-CM | POA: Diagnosis not present

## 2023-09-27 LAB — BASIC METABOLIC PANEL WITH GFR
Anion gap: 13 (ref 5–15)
BUN: 16 mg/dL (ref 6–20)
CO2: 21 mmol/L — ABNORMAL LOW (ref 22–32)
Calcium: 8.4 mg/dL — ABNORMAL LOW (ref 8.9–10.3)
Chloride: 100 mmol/L (ref 98–111)
Creatinine, Ser: 0.75 mg/dL (ref 0.44–1.00)
GFR, Estimated: >60 mL/min
Glucose, Bld: 322 mg/dL — ABNORMAL HIGH (ref 70–99)
Potassium: 4 mmol/L (ref 3.5–5.1)
Sodium: 134 mmol/L — ABNORMAL LOW (ref 135–145)

## 2023-09-27 LAB — URINALYSIS, ROUTINE W REFLEX MICROSCOPIC
Bilirubin Urine: NEGATIVE
Glucose, UA: >=500 mg/dL — AB
Ketones, ur: NEGATIVE mg/dL
Leukocytes,Ua: NEGATIVE
Nitrite: NEGATIVE
Protein, ur: >=300 mg/dL — AB
Specific Gravity, Urine: 1.018 (ref 1.005–1.030)
pH: 6 (ref 5.0–8.0)

## 2023-09-27 LAB — CBC
HCT: 39.7 % (ref 36.0–46.0)
Hemoglobin: 13.5 g/dL (ref 12.0–15.0)
MCH: 28.9 pg (ref 26.0–34.0)
MCHC: 34 g/dL (ref 30.0–36.0)
MCV: 85 fL (ref 80.0–100.0)
Platelets: 221 K/uL (ref 150–400)
RBC: 4.67 MIL/uL (ref 3.87–5.11)
RDW: 11.8 % (ref 11.5–15.5)
WBC: 9.2 K/uL (ref 4.0–10.5)
nRBC: 0 % (ref 0.0–0.2)

## 2023-09-27 LAB — RESP PANEL BY RT-PCR (RSV, FLU A&B, COVID)  RVPGX2
Influenza A by PCR: NEGATIVE
Influenza B by PCR: POSITIVE — AB
Resp Syncytial Virus by PCR: NEGATIVE
SARS Coronavirus 2 by RT PCR: NEGATIVE

## 2023-09-27 LAB — POC URINE PREG, ED: Preg Test, Ur: NEGATIVE

## 2023-09-27 LAB — TROPONIN I (HIGH SENSITIVITY)
Troponin I (High Sensitivity): 6 ng/L (ref <18)
Troponin I (High Sensitivity): 7 ng/L

## 2023-09-27 NOTE — ED Triage Notes (Signed)
 Pt to triage via wheelchair.  Pt has chest pain, cough, nasal congestion  for 1 day.  Pt also has sob  non smoker.  Pt alert.

## 2023-09-28 ENCOUNTER — Emergency Department
Admission: EM | Admit: 2023-09-28 | Discharge: 2023-09-28 | Discharge status: Against Medical Advice | Attending: Emergency Medicine | Admitting: Emergency Medicine

## 2023-09-28 ENCOUNTER — Telehealth: Payer: Self-pay | Admitting: Emergency Medicine

## 2023-09-28 DIAGNOSIS — I10 Essential (primary) hypertension: Secondary | ICD-10-CM | POA: Diagnosis not present

## 2023-09-28 DIAGNOSIS — E119 Type 2 diabetes mellitus without complications: Secondary | ICD-10-CM | POA: Diagnosis not present

## 2023-09-28 DIAGNOSIS — J189 Pneumonia, unspecified organism: Secondary | ICD-10-CM | POA: Diagnosis not present

## 2023-09-28 DIAGNOSIS — J101 Influenza due to other identified influenza virus with other respiratory manifestations: Secondary | ICD-10-CM | POA: Diagnosis not present

## 2023-09-28 DIAGNOSIS — Z79899 Other long term (current) drug therapy: Secondary | ICD-10-CM | POA: Diagnosis not present

## 2023-09-28 DIAGNOSIS — Z7984 Long term (current) use of oral hypoglycemic drugs: Secondary | ICD-10-CM | POA: Diagnosis not present

## 2023-09-28 DIAGNOSIS — J1100 Influenza due to unidentified influenza virus with unspecified type of pneumonia: Secondary | ICD-10-CM | POA: Diagnosis not present

## 2023-09-28 NOTE — ED Notes (Signed)
 No answer when called several times from lobby

## 2023-09-28 NOTE — Telephone Encounter (Signed)
 Called patient due to left emergency department before provider exam to inquire about condition and follow up plans.  She does plan to see her dr. Today.  I explained that the cxr recommends having exam and that her flu B was positive.  I released the cxr to her mychart so she can show it to her dr.

## 2023-09-29 DIAGNOSIS — J101 Influenza due to other identified influenza virus with other respiratory manifestations: Secondary | ICD-10-CM | POA: Diagnosis not present

## 2023-09-30 DIAGNOSIS — I10 Essential (primary) hypertension: Secondary | ICD-10-CM | POA: Diagnosis not present

## 2023-09-30 DIAGNOSIS — E669 Obesity, unspecified: Secondary | ICD-10-CM | POA: Diagnosis not present

## 2023-09-30 DIAGNOSIS — N049 Nephrotic syndrome with unspecified morphologic changes: Secondary | ICD-10-CM | POA: Diagnosis not present

## 2023-09-30 DIAGNOSIS — Z6837 Body mass index (BMI) 37.0-37.9, adult: Secondary | ICD-10-CM | POA: Diagnosis not present

## 2023-09-30 DIAGNOSIS — E785 Hyperlipidemia, unspecified: Secondary | ICD-10-CM | POA: Diagnosis not present

## 2023-09-30 DIAGNOSIS — E1165 Type 2 diabetes mellitus with hyperglycemia: Secondary | ICD-10-CM | POA: Diagnosis not present

## 2023-10-03 DIAGNOSIS — Z1331 Encounter for screening for depression: Secondary | ICD-10-CM | POA: Diagnosis not present

## 2023-10-03 DIAGNOSIS — E1165 Type 2 diabetes mellitus with hyperglycemia: Secondary | ICD-10-CM | POA: Diagnosis not present

## 2023-10-03 DIAGNOSIS — Z1389 Encounter for screening for other disorder: Secondary | ICD-10-CM | POA: Diagnosis not present

## 2023-10-03 DIAGNOSIS — H9201 Otalgia, right ear: Secondary | ICD-10-CM | POA: Diagnosis not present

## 2023-10-03 DIAGNOSIS — I10 Essential (primary) hypertension: Secondary | ICD-10-CM | POA: Diagnosis not present

## 2023-10-03 DIAGNOSIS — Z0131 Encounter for examination of blood pressure with abnormal findings: Secondary | ICD-10-CM | POA: Diagnosis not present

## 2023-10-03 DIAGNOSIS — R809 Proteinuria, unspecified: Secondary | ICD-10-CM | POA: Diagnosis not present

## 2023-10-03 DIAGNOSIS — Z712 Person consulting for explanation of examination or test findings: Secondary | ICD-10-CM | POA: Diagnosis not present

## 2023-11-05 DIAGNOSIS — Z7984 Long term (current) use of oral hypoglycemic drugs: Secondary | ICD-10-CM | POA: Diagnosis not present

## 2023-11-05 DIAGNOSIS — E119 Type 2 diabetes mellitus without complications: Secondary | ICD-10-CM | POA: Diagnosis not present

## 2023-11-05 DIAGNOSIS — I10 Essential (primary) hypertension: Secondary | ICD-10-CM | POA: Diagnosis not present

## 2023-11-05 DIAGNOSIS — K5900 Constipation, unspecified: Secondary | ICD-10-CM | POA: Diagnosis not present

## 2023-11-05 DIAGNOSIS — Z833 Family history of diabetes mellitus: Secondary | ICD-10-CM | POA: Diagnosis not present

## 2023-11-17 DIAGNOSIS — N049 Nephrotic syndrome with unspecified morphologic changes: Secondary | ICD-10-CM | POA: Diagnosis not present

## 2023-11-17 DIAGNOSIS — I10 Essential (primary) hypertension: Secondary | ICD-10-CM | POA: Diagnosis not present

## 2023-11-17 DIAGNOSIS — E1165 Type 2 diabetes mellitus with hyperglycemia: Secondary | ICD-10-CM | POA: Diagnosis not present

## 2023-12-02 DIAGNOSIS — E669 Obesity, unspecified: Secondary | ICD-10-CM | POA: Diagnosis not present

## 2023-12-02 DIAGNOSIS — I10 Essential (primary) hypertension: Secondary | ICD-10-CM | POA: Diagnosis not present

## 2023-12-02 DIAGNOSIS — N184 Chronic kidney disease, stage 4 (severe): Secondary | ICD-10-CM | POA: Diagnosis not present

## 2023-12-02 DIAGNOSIS — Z6837 Body mass index (BMI) 37.0-37.9, adult: Secondary | ICD-10-CM | POA: Diagnosis not present

## 2023-12-02 DIAGNOSIS — E1165 Type 2 diabetes mellitus with hyperglycemia: Secondary | ICD-10-CM | POA: Diagnosis not present

## 2023-12-18 ENCOUNTER — Other Ambulatory Visit: Payer: Self-pay

## 2023-12-18 ENCOUNTER — Emergency Department
Admission: EM | Admit: 2023-12-18 | Discharge: 2023-12-18 | Disposition: A | Discharge status: Home / Self Care | Attending: Emergency Medicine | Admitting: Emergency Medicine

## 2023-12-18 DIAGNOSIS — E119 Type 2 diabetes mellitus without complications: Secondary | ICD-10-CM | POA: Diagnosis not present

## 2023-12-18 DIAGNOSIS — I10 Essential (primary) hypertension: Secondary | ICD-10-CM | POA: Insufficient documentation

## 2023-12-18 DIAGNOSIS — K5900 Constipation, unspecified: Secondary | ICD-10-CM | POA: Insufficient documentation

## 2023-12-18 HISTORY — DX: Essential (primary) hypertension: I10

## 2023-12-18 HISTORY — DX: Proteinuria, unspecified: R80.9

## 2023-12-18 HISTORY — DX: Constipation, unspecified: K59.00

## 2023-12-18 LAB — CBC
HCT: 37.2 % (ref 36.0–46.0)
Hemoglobin: 12.8 g/dL (ref 12.0–15.0)
MCH: 28.4 pg (ref 26.0–34.0)
MCHC: 34.4 g/dL (ref 30.0–36.0)
MCV: 82.7 fL (ref 80.0–100.0)
Platelets: 306 K/uL (ref 150–400)
RBC: 4.5 MIL/uL (ref 3.87–5.11)
RDW: 11.9 % (ref 11.5–15.5)
WBC: 10.5 K/uL (ref 4.0–10.5)
nRBC: 0 % (ref 0.0–0.2)

## 2023-12-18 MED ORDER — FLEET ENEMA RE ENEM
1.0000 | ENEMA | Freq: Once | RECTAL | Status: AC
  Administered 2023-12-18: 1 via RECTAL

## 2023-12-18 NOTE — ED Provider Notes (Signed)
 [facility] Provider Note    Event Date/Time   First MD Initiated Contact with Patient 12/18/23 1309     (approximate)   History   Constipation, Abdominal Pain, and Back Pain   HPI  Carolyn Andrews is a 33 y.o. female with history of diabetes, constipation, hypertension and as listed in EMR presents to the emergency department for treatment and evaluation of constipation with last bowel movement being 2 days ago.  She is also having lower bilateral back pain and lower abdominal pain.  Symptoms all started about the same time.  Patient reports chronic constipation and takes Senna twice daily.  No dysuria. She has not had an evaluation by GI specialist.     Physical Exam    Vitals:   12/18/23 1257  BP: (!) 154/88  Pulse: 81  Resp: 20  Temp: 97.7 F (36.5 C)  SpO2: 100%    General: Awake, no distress.  CV:  Good peripheral perfusion.  Resp:  Normal effort.  Abd:  No distention.  Other:  Soft stool in lower rectum.    ED Results / Procedures / Treatments   Labs (all labs ordered are listed, but only abnormal results are displayed)  Labs Reviewed  CBC     EKG  Not indicated.   RADIOLOGY  Image and radiology report reviewed and interpreted by me. Radiology report consistent with the same.  Not indicated.  PROCEDURES:  Critical Care performed: No  Procedures   MEDICATIONS ORDERED IN ED:  Medications  sodium phosphate (FLEET) enema 1 enema (1 enema Rectal Given 12/18/23 1410)     IMPRESSION / MDM / ASSESSMENT AND PLAN / ED COURSE   I have reviewed the triage note and vital signs. Vital signs stable   Differential diagnosis includes, but is not limited to, constipation.  Patient's presentation is most consistent with exacerbation of chronic illness.  33 year old female presents to the ER for treatment of constipation. This is a chronic problem for her even though she takes 2 Senna Plus tablets daily. She  feels the urge to have a bowel movement, but when she tries only a tiny amount comes out. She is having abdominal and low back pain today, which is very common when she is constipated.  She states that she typically goes to [facility] when this happens and is treated with an enema.  She tells me she has never had a colonoscopy and has never been evaluated by gastroenterologist.  Outpatient record reviewed.  She was at [facility] emergency department at the end of June and was given soapsuds enema at that time.  Digital rectal exam performed.  She has soft stool load in the rectal vault.  Fleets enema ordered.  Patient was given the opportunity to perform this at home however she wants to do it here.  Per report from staff, patient did not tolerate the fleets enema very well and was only able to take in about half of it.  She is now trying to use the bedside commode.  Patient able to have a bowel movement.  She feels ready for discharge.  She was given a referral to see gastroenterologist.  She was also advised that she can use a fleets enema that can be purchased over-the-counter to prevent her from having to come to the emergency department.  She was advised that if she does this and does not have a bowel movement or she develops increasing abdominal pain that she should return to  the emergency department.     FINAL CLINICAL IMPRESSION(S) / ED DIAGNOSES   Final diagnoses:  Constipation, unspecified constipation type     Rx / DC Orders   ED Discharge Orders     None        Note:  This document was prepared using Dragon voice recognition software and may include unintentional dictation errors.   Herlinda Kirk NOVAK, FNP 12/18/23 1441    Dicky Anes, MD 12/18/23 [phone]

## 2023-12-18 NOTE — ED Triage Notes (Signed)
 Pt to ED for constipations since 2 days. LBM 2 days ago. Complains of lower bilateral back pain also and lower abdominal pain also since 2 days ago. Pt says constipation is a chronic problem for her. Takes Senna. Denies dysuria. Has DM type 2, takes metformin , Ozempic, Lantus.

## 2023-12-18 NOTE — Discharge Instructions (Signed)
 Follow-up with the gastrointestinal specialist for your chronic constipation.  When you are feeling constipated, fleets enema solution can be purchased at the pharmacy.  Using it at home would prevent you from having to come to the emergency department.  If it does not work or your abdominal pain gets worse, you should then come to the hospital.

## 2023-12-23 DIAGNOSIS — R1011 Right upper quadrant pain: Secondary | ICD-10-CM | POA: Diagnosis not present

## 2023-12-23 DIAGNOSIS — M545 Low back pain, unspecified: Secondary | ICD-10-CM | POA: Diagnosis not present

## 2023-12-23 DIAGNOSIS — Z5321 Procedure and treatment not carried out due to patient leaving prior to being seen by health care provider: Secondary | ICD-10-CM | POA: Diagnosis not present

## 2023-12-25 ENCOUNTER — Other Ambulatory Visit: Payer: Self-pay

## 2023-12-25 DIAGNOSIS — R1011 Right upper quadrant pain: Secondary | ICD-10-CM | POA: Diagnosis not present

## 2023-12-25 DIAGNOSIS — E1122 Type 2 diabetes mellitus with diabetic chronic kidney disease: Secondary | ICD-10-CM | POA: Diagnosis not present

## 2023-12-25 DIAGNOSIS — I129 Hypertensive chronic kidney disease with stage 1 through stage 4 chronic kidney disease, or unspecified chronic kidney disease: Secondary | ICD-10-CM | POA: Insufficient documentation

## 2023-12-25 DIAGNOSIS — Z79899 Other long term (current) drug therapy: Secondary | ICD-10-CM | POA: Insufficient documentation

## 2023-12-25 DIAGNOSIS — K760 Fatty (change of) liver, not elsewhere classified: Secondary | ICD-10-CM | POA: Diagnosis not present

## 2023-12-25 DIAGNOSIS — Z7984 Long term (current) use of oral hypoglycemic drugs: Secondary | ICD-10-CM | POA: Diagnosis not present

## 2023-12-25 DIAGNOSIS — N189 Chronic kidney disease, unspecified: Secondary | ICD-10-CM | POA: Diagnosis not present

## 2023-12-25 LAB — CBC
HCT: 35.2 % — ABNORMAL LOW (ref 36.0–46.0)
Hemoglobin: 12.1 g/dL (ref 12.0–15.0)
MCH: 28.7 pg (ref 26.0–34.0)
MCHC: 34.4 g/dL (ref 30.0–36.0)
MCV: 83.4 fL (ref 80.0–100.0)
Platelets: 288 K/uL (ref 150–400)
RBC: 4.22 MIL/uL (ref 3.87–5.11)
RDW: 11.9 % (ref 11.5–15.5)
WBC: 10.5 K/uL (ref 4.0–10.5)
nRBC: 0 % (ref 0.0–0.2)

## 2023-12-25 LAB — COMPREHENSIVE METABOLIC PANEL WITH GFR
ALT: 13 U/L (ref 0–44)
AST: 15 U/L (ref 15–41)
Albumin: 2.9 g/dL — ABNORMAL LOW (ref 3.5–5.0)
Alkaline Phosphatase: 56 U/L (ref 38–126)
Anion gap: 7 (ref 5–15)
BUN: 25 mg/dL — ABNORMAL HIGH (ref 6–20)
CO2: 22 mmol/L (ref 22–32)
Calcium: 8.9 mg/dL (ref 8.9–10.3)
Chloride: 107 mmol/L (ref 98–111)
Creatinine, Ser: 0.82 mg/dL (ref 0.44–1.00)
GFR, Estimated: >60 mL/min (ref >60)
Glucose, Bld: 221 mg/dL — ABNORMAL HIGH (ref 70–99)
Potassium: 4 mmol/L (ref 3.5–5.1)
Sodium: 136 mmol/L (ref 135–145)
Total Bilirubin: 0.6 mg/dL (ref 0.0–1.2)
Total Protein: 6.9 g/dL (ref 6.5–8.1)

## 2023-12-25 LAB — LIPASE, BLOOD: Lipase: 38 U/L (ref 11–51)

## 2023-12-25 NOTE — ED Triage Notes (Signed)
 Pt is coming in for right lower quadrant pain that has been ongoing x4 days, she mentions the pain has been progressing over the last 4 days to the point now that the RLQ is tender to touch. She has no urinary symptoms, no diarrhea. She is otherwise stable with no chest pain or shortness of breath.

## 2023-12-26 ENCOUNTER — Emergency Department

## 2023-12-26 ENCOUNTER — Emergency Department
Admission: EM | Admit: 2023-12-26 | Discharge: 2023-12-26 | Disposition: A | Discharge status: Home / Self Care | Attending: Emergency Medicine | Admitting: Emergency Medicine

## 2023-12-26 DIAGNOSIS — R1011 Right upper quadrant pain: Secondary | ICD-10-CM

## 2023-12-26 DIAGNOSIS — K760 Fatty (change of) liver, not elsewhere classified: Secondary | ICD-10-CM

## 2023-12-26 LAB — URINALYSIS, ROUTINE W REFLEX MICROSCOPIC
Bilirubin Urine: NEGATIVE
Glucose, UA: >=500 mg/dL — AB
Hgb urine dipstick: NEGATIVE
Ketones, ur: NEGATIVE mg/dL
Leukocytes,Ua: NEGATIVE
Nitrite: NEGATIVE
Protein, ur: >=300 mg/dL — AB
Specific Gravity, Urine: 1.022 (ref 1.005–1.030)
pH: 5 (ref 5.0–8.0)

## 2023-12-26 LAB — POC URINE PREG, ED: Preg Test, Ur: NEGATIVE

## 2023-12-26 MED ORDER — DICYCLOMINE HCL 10 MG PO CAPS
20.0000 mg | ORAL_CAPSULE | Freq: Once | ORAL | Status: AC
  Administered 2023-12-26: 20 mg via ORAL
  Filled 2023-12-26: qty 2

## 2023-12-26 MED ORDER — SODIUM CHLORIDE 0.9 % IV BOLUS (SEPSIS)
1000.0000 mL | Freq: Once | INTRAVENOUS | Status: AC
  Administered 2023-12-26: 1000 mL via INTRAVENOUS

## 2023-12-26 MED ORDER — PANTOPRAZOLE SODIUM 40 MG PO TBEC: 40.0000 mg | DELAYED_RELEASE_TABLET | Freq: Every day | ORAL | 1 refills | Status: DC

## 2023-12-26 MED ORDER — ALUM & MAG HYDROXIDE-SIMETH 200-200-20 MG/5ML PO SUSP
30.0000 mL | Freq: Once | ORAL | Status: AC
  Administered 2023-12-26: 30 mL via ORAL
  Filled 2023-12-26: qty 30

## 2023-12-26 MED ORDER — IOHEXOL 300 MG/ML  SOLN
100.0000 mL | Freq: Once | INTRAMUSCULAR | Status: AC | PRN
  Administered 2023-12-26: 100 mL via INTRAVENOUS

## 2023-12-26 MED ORDER — PANTOPRAZOLE SODIUM 40 MG IV SOLR
40.0000 mg | Freq: Once | INTRAVENOUS | Status: AC
  Administered 2023-12-26: 40 mg via INTRAVENOUS
  Filled 2023-12-26: qty 10

## 2023-12-26 MED ORDER — MORPHINE SULFATE (PF) 4 MG/ML IV SOLN
4.0000 mg | Freq: Once | INTRAVENOUS | Status: AC
  Administered 2023-12-26: 4 mg via INTRAVENOUS
  Filled 2023-12-26: qty 1

## 2023-12-26 MED ORDER — ONDANSETRON 4 MG PO TBDP: 4.0000 mg | ORAL_TABLET | Freq: Four times a day (QID) | ORAL | 0 refills | Status: DC | PRN

## 2023-12-26 MED ORDER — DICYCLOMINE HCL 20 MG PO TABS: 20.0000 mg | ORAL_TABLET | Freq: Three times a day (TID) | ORAL | 0 refills | Status: AC | PRN

## 2023-12-26 MED ORDER — ONDANSETRON HCL 4 MG/2ML IJ SOLN
4.0000 mg | Freq: Once | INTRAMUSCULAR | Status: AC
  Administered 2023-12-26: 4 mg via INTRAVENOUS
  Filled 2023-12-26: qty 2

## 2023-12-26 NOTE — Discharge Instructions (Addendum)
Please avoid NSAIDs such as aspirin (Goody powders), ibuprofen (Motrin, Advil), naproxen (Aleve) as these may worsen your symptoms.  Tylenol 1000 mg every 6 hours is safe to take as long as you have no history of liver problems (heavy alcohol use, cirrhosis, hepatitis).  Please avoid spicy, acidic (citrus fruits, tomato based sauces, salsa), greasy, fatty foods.  Please avoid caffeine and alcohol.  Smoking can also make GERD/acid reflux worse.  Over the counter medications such as TUMS, Maalox or Mylanta, pepcid, Prilosec or Nexium may help with your symptoms.  Do not take Prilosec or Nexium if you are already prescribed a proton pump inhibitor.

## 2023-12-26 NOTE — ED Provider Notes (Signed)
 [facility] Provider Note    Event Date/Time   First MD Initiated Contact with Patient 12/26/23 [phone]     (approximate)   History   Abdominal Pain   HPI  Carolyn Andrews is a 33 y.o. female with history of CKD, hypertension, type 2 diabetes, obesity who presents to the emergency department complaints of right upper quadrant abdominal pain ongoing for the past 4 days, worsened tonight.  No vomiting, diarrhea, fevers, chest pain or shortness of breath, dysuria, hematuria, vaginal bleeding or discharge.  No prior abdominal surgeries.  States pain worse after eating.  Patient reports she has not taken her blood pressure medication or diabetes medications today given she has not been feeling well.   History provided by patient, family.    Past Medical History:  Diagnosis Date   Constipation    Diabetes (HCC)    type 2: metformin , Lantus, Ozempic   Hypertension    Proteinuria    has been going on for awhile    Past Surgical History:  Procedure Laterality Date   COLONOSCOPY WITH PROPOFOL  N/A 05/04/2023   Procedure: COLONOSCOPY WITH PROPOFOL ;  Surgeon: Jinny Carmine, MD;  Location: [facility] ENDOSCOPY;  Service: Endoscopy;  Laterality: N/A;    MEDICATIONS:  Prior to Admission medications   Medication Sig Start Date End Date Taking? Authorizing Provider  amLODipine (NORVASC) 10 MG tablet Take 10 mg by mouth daily. 04/06/23   [provider]  HUMALOG KWIKPEN 100 UNIT/ML KwikPen Inject 5 Units into the skin 3 (three) times daily. 04/06/23   [provider]  lactulose  (CHRONULAC ) 10 GM/15ML solution SMARTSIG:Milliliter(s) By Mouth 04/08/23   [provider]  linaclotide  (LINZESS ) 290 MCG CAPS capsule Take 1 capsule (290 mcg total) by mouth daily before breakfast. 05/24/23   Jinny Carmine, MD  metFORMIN  (GLUCOPHAGE ) 500 MG tablet Take 500 mg by mouth 2 (two) times daily. 11/15/22   [provider]  rosuvastatin (CRESTOR)  10 MG tablet Take 10 mg by mouth at bedtime. 11/15/22   [provider]  senna-docusate (SENOKOT-S) 8.6-50 MG tablet Take 2 tablets by mouth daily. 03/23/23   [provider]    Physical Exam   Triage Vital Signs: ED Triage Vitals  Encounter Vitals Group     BP 12/25/23 2124 (!) 192/90     Girls Systolic BP Percentile --      Girls Diastolic BP Percentile --      Boys Systolic BP Percentile --      Boys Diastolic BP Percentile --      Pulse Rate 12/25/23 2124 88     Resp 12/25/23 2124 18     Temp 12/25/23 2124 98.4 F (36.9 C)     Temp Source 12/25/23 2124 Oral     SpO2 12/25/23 2124 100 %     Weight 12/25/23 2124 194 lb (88 kg)     Height 12/25/23 2124 5' (1.524 m)     Head Circumference --      Peak Flow --      Pain Score 12/25/23 2128 9     Pain Loc --      Pain Education --      Exclude from Growth Chart --     Most recent vital signs: Vitals:   12/25/23 2124 12/26/23 0234  BP: (!) 192/90 (!) 189/86  Pulse: 88 83  Resp: 18 18  Temp: 98.4 F (36.9 C)   SpO2: 100% 100%    CONSTITUTIONAL: Alert, responds appropriately  to questions. Well-appearing; well-nourished HEAD: Normocephalic, atraumatic EYES: Conjunctivae clear, pupils appear equal, sclera nonicteric ENT: normal nose; moist mucous membranes NECK: Supple, normal ROM CARD: RRR; S1 and S2 appreciated RESP: Normal chest excursion without splinting or tachypnea; breath sounds clear and equal bilaterally; no wheezes, no rhonchi, no rales, no hypoxia or respiratory distress, speaking full sentences ABD/GI: Non-distended; soft, right upper quadrant tenderness with positive Murphy sign, no tenderness at McBurney's point BACK: The back appears normal EXT: Normal ROM in all joints; no deformity noted, no edema SKIN: Normal color for age and race; warm; no rash on exposed skin NEURO: Moves all extremities equally, normal speech PSYCH: The patient's mood and manner are appropriate.   ED Results /  Procedures / Treatments   LABS: (all labs ordered are listed, but only abnormal results are displayed) Labs Reviewed  COMPREHENSIVE METABOLIC PANEL WITH GFR - Abnormal; Notable for the following components:      Result Value   Glucose, Bld 221 (*)    BUN 25 (*)    Albumin 2.9 (*)    All other components within normal limits  CBC - Abnormal; Notable for the following components:   HCT 35.2 (*)    All other components within normal limits  URINALYSIS, ROUTINE W REFLEX MICROSCOPIC - Abnormal; Notable for the following components:   Color, Urine YELLOW (*)    APPearance HAZY (*)    Glucose, UA >=500 (*)    Protein, ur >=300 (*)    Bacteria, UA RARE (*)    All other components within normal limits  LIPASE, BLOOD  POC URINE PREG, ED     EKG:  EKG Interpretation Date/Time:    Ventricular Rate:    PR Interval:    QRS Duration:    QT Interval:    QTC Calculation:   R Axis:      Text Interpretation:           RADIOLOGY: My personal review and interpretation of imaging: Right upper quad abdominal ultrasound shows hepatic steatosis.  CT of the abdomen pelvis normal, normal appendix.  I have personally reviewed all radiology reports.   CT ABDOMEN PELVIS W CONTRAST Result Date: 12/26/2023 EXAM: CT ABDOMEN AND PELVIS WITH CONTRAST 12/26/2023 02:29:29 AM TECHNIQUE: CT of the abdomen and pelvis was performed with the administration of intravenous contrast. Multiplanar reformatted images are provided for review. Automated exposure control, iterative reconstruction, and/or weight based adjustment of the mA/kV was utilized to reduce the radiation dose to as low as reasonably achievable. COMPARISON: Same day ultrasound. CLINICAL HISTORY: Right sided abdominal pain. The pain has been ongoing for 4 days and has been progressing to the point now that the right lower quadrant is tender to touch. No urinary symptoms, no diarrhea. The patient is otherwise stable with no chest pain or shortness of  breath. FINDINGS: LOWER CHEST: No acute abnormality. LIVER: Hepatic steatosis. GALLBLADDER AND BILE DUCTS: Gallbladder is unremarkable. No biliary ductal dilatation. SPLEEN: No acute abnormality. PANCREAS: No acute abnormality. ADRENAL GLANDS: No acute abnormality. KIDNEYS, URETERS AND BLADDER: No stones in the kidneys or ureters. No hydronephrosis. No perinephric or periureteral stranding. Urinary bladder is unremarkable. GI AND BOWEL: Stomach demonstrates no acute abnormality. There is no bowel obstruction. No bowel wall thickening. Normal appendix. PERITONEUM AND RETROPERITONEUM: No ascites. No free air. VASCULATURE: Aorta is normal in caliber. LYMPH NODES: No lymphadenopathy. REPRODUCTIVE ORGANS: No acute abnormality. BONES AND SOFT TISSUES: No acute osseous abnormality. No focal soft tissue abnormality. IMPRESSION: 1. No acute  findings in the abdomen or pelvis. Electronically signed by: Norman Gatlin MD 12/26/2023 02:35 AM EDT RP Workstation: [id number]   US  ABDOMEN LIMITED RUQ (LIVER/GB) Result Date: 12/26/2023 CLINICAL DATA:  Right upper quadrant pain. EXAM: ULTRASOUND ABDOMEN LIMITED RIGHT UPPER QUADRANT COMPARISON:  None Available. FINDINGS: Gallbladder: No gallstones or wall thickening visualized (2.4 mm). No sonographic Murphy sign noted by sonographer. Common bile duct: Diameter: 7.3 mm Liver: No focal lesion identified. Diffusely increased echogenicity of the liver parenchyma is noted. Portal vein is patent on color Doppler imaging with normal direction of blood flow towards the liver. Other: The study is technically limited secondary to the patient's body habitus, as per the ultrasound technologist. IMPRESSION: Hepatic steatosis. Electronically Signed   By: Suzen Dials M.D.   On: 12/26/2023 01:31     PROCEDURES:  Critical Care performed: No    Procedures    IMPRESSION / MDM / ASSESSMENT AND PLAN / ED COURSE  I reviewed the triage vital signs and the nursing notes.    Patient  here with upper abdominal pain.   DIFFERENTIAL DIAGNOSIS (includes but not limited to):   Cholelithiasis, cholecystitis, choledocholithiasis, pancreatitis, GERD, gastritis, less likely cholangitis, appendicitis, bowel obstruction, diverticulitis, perforation   Patient's presentation is most consistent with acute presentation with potential threat to life or bodily function.   PLAN: Labs show no leukocytosis.  Normal LFTs and lipase.  Will obtain urinalysis, right upper quadrant ultrasound.  Will give IV fluids, pain and nausea medicine.   MEDICATIONS GIVEN IN ED: Medications  alum & mag hydroxide-simeth (MAALOX/MYLANTA) 200-200-20 MG/5ML suspension 30 mL (has no administration in time range)  dicyclomine  (BENTYL ) capsule 20 mg (has no administration in time range)  morphine  (PF) 4 MG/ML injection 4 mg (4 mg Intravenous Given 12/26/23 0215)  ondansetron  (ZOFRAN ) injection 4 mg (4 mg Intravenous Given 12/26/23 0213)  sodium chloride  0.9 % bolus 1,000 mL (1,000 mLs Intravenous New Bag/Given 12/26/23 0212)  iohexol  (OMNIPAQUE ) 300 MG/ML solution 100 mL (100 mLs Intravenous Contrast Given 12/26/23 0221)  pantoprazole  (PROTONIX ) injection 40 mg (40 mg Intravenous Given 12/26/23 0232)     ED COURSE: Right upper quadrant ultrasound reviewed and interpreted by myself and the radiologist and shows hepatic steatosis.  Will obtain CT of the abdomen pelvis to evaluate the appendix although suspicion is lower given pain is more in the right upper and mid abdomen.   CT of the abdomen pelvis reviewed and interpreted by myself and the radiologist and shows no acute abnormality, normal appendix.  Patient reports feeling better.  We discussed that this could be gastritis, GERD.  Recommended follow-up with PCP, avoiding alcohol, NSAIDs.  Recommended bland diet.  Will discharge with Protonix , Bentyl , Zofran  for symptomatic relief at home.   At this time, I do not feel there is any life-threatening condition present.  I reviewed all nursing notes, vitals, pertinent previous records.  All lab and urine results, EKGs, imaging ordered have been independently reviewed and interpreted by myself.  I reviewed all available radiology reports from any imaging ordered this visit.  Based on my assessment, I feel the patient is safe to be discharged home without further emergent workup and can continue workup as an outpatient as needed. Discussed all findings, treatment plan as well as usual and customary return precautions.  They verbalize understanding and are comfortable with this plan.  Outpatient follow-up has been provided as needed.  All questions have been answered.    CONSULTS:  none   OUTSIDE RECORDS REVIEWED: Reviewed  last nephrology note on 12/02/2023.       FINAL CLINICAL IMPRESSION(S) / ED DIAGNOSES   Final diagnoses:  RUQ abdominal pain  Hepatic steatosis     Rx / DC Orders   ED Discharge Orders          Ordered    pantoprazole  (PROTONIX ) 40 MG tablet  Daily        12/26/23 0254    ondansetron  (ZOFRAN -ODT) 4 MG disintegrating tablet  Every 6 hours PRN        12/26/23 0254    dicyclomine  (BENTYL ) 20 MG tablet  Every 8 hours PRN        12/26/23 0254             Note:  This document was prepared using Dragon voice recognition software and may include unintentional dictation errors.   Shara Hartis, Josette SAILOR, DO 12/26/23 [phone]

## 2023-12-27 DIAGNOSIS — F419 Anxiety disorder, unspecified: Secondary | ICD-10-CM | POA: Diagnosis not present

## 2023-12-27 DIAGNOSIS — N181 Chronic kidney disease, stage 1: Secondary | ICD-10-CM | POA: Diagnosis not present

## 2023-12-27 DIAGNOSIS — E1122 Type 2 diabetes mellitus with diabetic chronic kidney disease: Secondary | ICD-10-CM | POA: Diagnosis not present

## 2023-12-27 DIAGNOSIS — F32A Depression, unspecified: Secondary | ICD-10-CM | POA: Diagnosis not present

## 2023-12-27 DIAGNOSIS — Z794 Long term (current) use of insulin: Secondary | ICD-10-CM | POA: Diagnosis not present

## 2023-12-27 DIAGNOSIS — Z7984 Long term (current) use of oral hypoglycemic drugs: Secondary | ICD-10-CM | POA: Diagnosis not present

## 2023-12-27 DIAGNOSIS — R1011 Right upper quadrant pain: Secondary | ICD-10-CM | POA: Diagnosis not present

## 2023-12-27 DIAGNOSIS — K760 Fatty (change of) liver, not elsewhere classified: Secondary | ICD-10-CM | POA: Diagnosis not present

## 2023-12-27 DIAGNOSIS — E1165 Type 2 diabetes mellitus with hyperglycemia: Secondary | ICD-10-CM | POA: Diagnosis not present

## 2023-12-27 DIAGNOSIS — I129 Hypertensive chronic kidney disease with stage 1 through stage 4 chronic kidney disease, or unspecified chronic kidney disease: Secondary | ICD-10-CM | POA: Diagnosis not present

## 2023-12-29 DIAGNOSIS — E1165 Type 2 diabetes mellitus with hyperglycemia: Secondary | ICD-10-CM | POA: Diagnosis not present

## 2023-12-29 DIAGNOSIS — I10 Essential (primary) hypertension: Secondary | ICD-10-CM | POA: Diagnosis not present

## 2023-12-29 DIAGNOSIS — N184 Chronic kidney disease, stage 4 (severe): Secondary | ICD-10-CM | POA: Diagnosis not present

## 2024-02-23 DIAGNOSIS — B029 Zoster without complications: Secondary | ICD-10-CM | POA: Diagnosis not present

## 2024-02-23 DIAGNOSIS — E785 Hyperlipidemia, unspecified: Secondary | ICD-10-CM | POA: Diagnosis not present

## 2024-02-23 DIAGNOSIS — E1165 Type 2 diabetes mellitus with hyperglycemia: Secondary | ICD-10-CM | POA: Diagnosis not present

## 2024-02-23 DIAGNOSIS — D649 Anemia, unspecified: Secondary | ICD-10-CM | POA: Diagnosis not present

## 2024-02-23 DIAGNOSIS — E1110 Type 2 diabetes mellitus with ketoacidosis without coma: Secondary | ICD-10-CM | POA: Diagnosis not present

## 2024-02-23 DIAGNOSIS — N189 Chronic kidney disease, unspecified: Secondary | ICD-10-CM | POA: Diagnosis not present

## 2024-02-23 DIAGNOSIS — I10 Essential (primary) hypertension: Secondary | ICD-10-CM | POA: Diagnosis not present

## 2024-02-23 DIAGNOSIS — F419 Anxiety disorder, unspecified: Secondary | ICD-10-CM | POA: Diagnosis not present

## 2024-02-23 DIAGNOSIS — Z7984 Long term (current) use of oral hypoglycemic drugs: Secondary | ICD-10-CM | POA: Diagnosis not present

## 2024-02-23 DIAGNOSIS — N041 Nephrotic syndrome with focal and segmental glomerular lesions: Secondary | ICD-10-CM | POA: Diagnosis not present

## 2024-02-23 DIAGNOSIS — Z7985 Long-term (current) use of injectable non-insulin antidiabetic drugs: Secondary | ICD-10-CM | POA: Diagnosis not present

## 2024-02-23 DIAGNOSIS — E1122 Type 2 diabetes mellitus with diabetic chronic kidney disease: Secondary | ICD-10-CM | POA: Diagnosis not present

## 2024-02-23 DIAGNOSIS — Z794 Long term (current) use of insulin: Secondary | ICD-10-CM | POA: Diagnosis not present

## 2024-02-23 DIAGNOSIS — Z6836 Body mass index (BMI) 36.0-36.9, adult: Secondary | ICD-10-CM | POA: Diagnosis not present

## 2024-02-23 DIAGNOSIS — E6609 Other obesity due to excess calories: Secondary | ICD-10-CM | POA: Diagnosis not present

## 2024-02-23 DIAGNOSIS — Z91199 Patient's noncompliance with other medical treatment and regimen due to unspecified reason: Secondary | ICD-10-CM | POA: Diagnosis not present

## 2024-02-23 DIAGNOSIS — R809 Proteinuria, unspecified: Secondary | ICD-10-CM | POA: Diagnosis not present

## 2024-02-23 NOTE — ED Provider Notes (Signed)
 [facility] Emergency Department Provider Note   ED Clinical Impression   Final diagnoses:  Diabetic ketoacidosis without coma associated with type 2 diabetes mellitus (CMS-HCC) (Primary)    Impression, Medical Decision Making, ED Course   Medical Decision Making 33 year old female with a history of diabetes mellitus presented with markedly elevated blood glucose (>600 mg/dL), anion gap metabolic acidosis, increased thirst, polyuria, dizziness, abdominal pain, and lower extremity edema. She denied missed medication doses or dietary changes and reported chronic lower extremity swelling managed with diuretics. Exam revealed abdominal tenderness and significant lower extremity edema.  Differential diagnosis includes, but is not limited to: - Diabetic Ketoacidosis (DKA): Marked hyperglycemia, elevated anion gap, polyuria, polydipsia, abdominal pain, and dizziness are consistent with DKA. - Urinary Tract Infection (UTI): UTI considered due to glucosuria and increased urinary frequency, with UA ordered to rule out infection.  Diabetic Ketoacidosis (DKA) / Possible Urinary Tract Infection (UTI) - Admit for management of DKA - Administer IV insulin  for glucose control - Monitor blood glucose levels closely - Order CBC, CMP, HCG, UA, and beta hydroxybutyrate - Check urine for infection  Diagnostic workup as below.   Orders Placed This Encounter  Procedures  . Urine Culture  . Blood Culture #1  . Blood Culture #2  . Comprehensive Metabolic Panel  . CBC w/ Differential  . Beta Hydroxybutyrate  . hCG QUANTitative, Blood  . Urinalysis with Microscopy with Culture Reflex  . Potassium Level  . Magnesium  Level  . Phosphorus Level  . Blood Gas, Venous  . Lactate, Venous, Whole Blood  . Potassium Level  . CBC  . Blood Gas, Venous  . CBC  . Potassium Level  . Basic Metabolic Panel  . Magnesium  Level  . Phosphorus Level  . Nutrition Therapy Consistent Carb; Consistent Carb 75/75/75  (5/5/5)  . Notify Provider  . Place patient on cardiac monitor  . Continuous Pulse Oximetry  . Vital signs  . Notify Provider  . Notify Provider  . Measure height  . Weigh patient  . Notify Provider  . Full Code  . ECG 12 Lead  . Insert peripheral IV  . Insert peripheral IV  . Place Patient in Bed  . ED Admit Decision  . Change Patient Level of Care    ED Course as of 02/24/24 1932  Thu Feb 23, 2024  1825 Glucose, POC(!!): >600  1825 hCG Quant: <2.6  1825 BETAHYDRO(!): 0.66  1825 Glucose, UA(!): >1000 mg/dL  8174 Ketones, UA: Negative  1837 Will start Endo tool  1837 Glucose(!!): 733  2106 Patient paged out for admission  2121 Glucose, POC(!!): 463  2258 Patient signed out to admission team at [facility] family medicine    MDM Elements Discussion of Management with other Physicians, QHP or Appropriate Source: Admitting team - [facility] ____________________________________________  The case was discussed with the attending physician, who is in agreement with the above assessment and plan.    History   Chief Complaint Chief Complaint  Patient presents with  . Elevated Blood Glucose Symptomatic    HPI  History of Present Illness Carolyn Andrews is a 33 year old female with diabetes who presents with diabetic ketoacidosis. She is accompanied by her husband. She was referred by her primary care provider for evaluation of diabetic ketoacidosis after routine blood work.  Routine blood work showed a glucose level of 642 mg/dL and an elevated anion gap of 16. She has not missed any doses of her diabetes medications, which include metformin , Lantus  at night, and Ozempic once a week. There was a recent change in her metformin  dosage, but she has not yet started the new regimen.  She experiences symptoms of increased thirst and frequent urination. She also reports abdominal pain with a burning sensation localized to the right side of her abdomen. No  burning during urination.  She experiences dizziness and significant swelling in her feet, which sometimes extends up her legs. She has been taking a diuretic to manage the swelling. She has not been admitted to the hospital for diabetes-related issues in the past. There is no change in her diet.  Past Medical History[1]  Past Surgical History[2]  Active Medications[3]   Allergies[4]  Family History[5]  Short Social History[6]   Physical Exam   VITAL SIGNS:    Vitals:   02/24/24 0800 02/24/24 1115 02/24/24 1200 02/24/24 1600  BP: 151/79 156/72 152/79 155/67  Pulse: 71 72 63 74  Resp: 17 13 11 19   Temp: 37.2 C (98.9 F)  36.9 C (98.4 F) 36.6 C (97.9 F)  TempSrc: Oral  Oral Oral  SpO2: 100% 99% 97% 97%  Weight:      Height:        Constitutional: Alert and oriented. No acute distress. Eyes: Conjunctivae are normal. HEENT: Normocephalic and atraumatic. Conjunctivae clear. No congestion. Moist mucous membranes.  Cardiovascular: Rate as above, regular rhythm. Normal and symmetric distal pulses. Brisk capillary refill. Normal skin turgor. Respiratory: Normal respiratory effort. Breath sounds are normal. There are no wheezing or crackles heard. Gastrointestinal: Soft, non-distended, minimal right sided and suprapubic tenderness. Genitourinary: Deferred. Musculoskeletal: Non-tender with normal range of motion in all extremities. 1+ pitting edema bilateral lateral lower extremities from ankle to mid shin. Neurologic: Normal speech and language. No gross focal neurologic deficits are appreciated. Patient is moving all extremities equally, face is symmetric at rest and with speech. Skin: Skin is warm, dry and intact. No rash noted. Psychiatric: Mood and affect are normal. Speech and behavior are normal.   Radiology   No orders to display   Pertinent labs & imaging results that were available during my care of the patient were independently interpreted by me and considered in  my medical decision making (see chart for details).  Portions of this record have been created using Scientist, clinical (histocompatibility and immunogenetics). Dictation errors have been sought, but may not have been identified and corrected.       [1] Past Medical History: Diagnosis Date  . Anxiety and depression   . Diabetes mellitus (CMS-HCC)   . Female infertility   . Hypertension   . Irregular periods   . Obesity (BMI 30.0-34.9)   . Polycystic ovary syndrome   [2] No past surgical history on file. [3] Current Facility-Administered Medications  Medication Dose Route Frequency Provider Last Rate Last Admin  . atorvastatin (LIPITOR) tablet 20 mg  20 mg Oral Nightly Caralee Almeda HERO, MD   20 mg at 02/24/24 0201  . [Provider Hold] chlorthalidone (HYGROTON) tablet 25 mg  25 mg Oral Daily Carnes, Almeda HERO, MD      . dextrose 50 % in water (D50W) 50 % solution 12.5 g  12.5 g Intravenous Q15 Min PRN King, Katharine M, MD      . diclofenac sodium (VOLTAREN) 1 % gel 2 g  2 g Topical QID PRN Caralee Almeda HERO, MD      . senna [facility]) tablet 1 tablet  1 tablet Oral BID PRN Caralee Almeda HERO, MD  And  . docusate sodium  (COLACE) capsule 100 mg  100 mg Oral BID PRN Caralee Almeda HERO, MD      . famotidine (PEPCID) tablet 40 mg  40 mg Oral Daily Caralee Almeda HERO, MD   40 mg at 02/24/24 0839  . [Provider Hold] furosemide (LASIX) tablet 40 mg  40 mg Oral Nightly Caralee Almeda HERO, MD      . gabapentin (NEURONTIN) capsule 100 mg  100 mg Oral TID King, Katharine M, MD   100 mg at 02/24/24 1319  . glucagon injection 1 mg  1 mg Intramuscular Once PRN King, Katharine M, MD      . glucose chewable tablet 16 g  16 g Oral Q10 Min PRN King, Katharine M, MD      . insulin  glargine (LANTUS) injection BASAL 30 Units  30 Units Subcutaneous Nightly King, Katharine M, MD      . insulin  lispro (HumaLOG) injection CORRECTIONAL 0-20 Units  0-20 Units Subcutaneous ACHS King, Katharine M, MD   4 Units at 02/24/24 1739  . lidocaine  (ASPERCREME) 4 % 1  patch  1 patch Transdermal Daily King, Katharine M, MD      . melatonin tablet 3 mg  3 mg Oral Nightly PRN Caralee Almeda HERO, MD      . NOREEN ON 02/25/2024] metFORMIN  (GLUCOPHAGE -XR) 24 hr tablet 1,500 mg  1,500 mg Oral Daily King, Katharine M, MD      . NOREEN ON 02/25/2024] NIFEdipine (PROCARDIA XL) 24 hr tablet 60 mg  60 mg Oral Daily King, Katharine M, MD      . polyethylene glycol (MIRALAX ) packet 17 g  17 g Oral Daily PRN Caralee Almeda HERO, MD      . valsartan (DIOVAN) tablet 80 mg  80 mg Oral Daily King, Katharine M, MD      [4] Allergies Allergen Reactions  . Lisinopril Swelling  [5] Family History Problem Relation Age of Onset  . Hypertension Mother   . Diabetes Mother   . Obesity Sister   . Clotting disorder Sister   . Kidney disease Neg Hx   . Thyroid disease Neg Hx   . Osteoporosis Neg Hx   . Coronary artery disease Neg Hx   [6] Social History Tobacco Use  . Smoking status: Never  . Smokeless tobacco: Never  Substance Use Topics  . Alcohol use: No    Alcohol/week: 0.0 standard drinks of alcohol  . Drug use: No   Mills Stabs, MD Resident 02/24/24 [id number]

## 2024-02-25 DIAGNOSIS — N189 Chronic kidney disease, unspecified: Secondary | ICD-10-CM | POA: Diagnosis not present

## 2024-02-25 DIAGNOSIS — E1110 Type 2 diabetes mellitus with ketoacidosis without coma: Secondary | ICD-10-CM | POA: Diagnosis not present

## 2024-02-25 DIAGNOSIS — E1122 Type 2 diabetes mellitus with diabetic chronic kidney disease: Secondary | ICD-10-CM | POA: Diagnosis not present

## 2024-02-25 DIAGNOSIS — D631 Anemia in chronic kidney disease: Secondary | ICD-10-CM | POA: Diagnosis not present

## 2024-02-25 DIAGNOSIS — E1165 Type 2 diabetes mellitus with hyperglycemia: Secondary | ICD-10-CM | POA: Diagnosis not present

## 2024-02-25 DIAGNOSIS — B029 Zoster without complications: Secondary | ICD-10-CM | POA: Diagnosis not present

## 2024-02-25 DIAGNOSIS — E785 Hyperlipidemia, unspecified: Secondary | ICD-10-CM | POA: Diagnosis not present

## 2024-02-25 DIAGNOSIS — N041 Nephrotic syndrome with focal and segmental glomerular lesions: Secondary | ICD-10-CM | POA: Diagnosis not present

## 2024-02-25 DIAGNOSIS — I959 Hypotension, unspecified: Secondary | ICD-10-CM | POA: Diagnosis not present

## 2024-02-25 NOTE — Discharge Summary (Signed)
 ------------------------------------------------------------------------------- Attestation signed by Lizette Lango, MD at 02/25/24 1720 Discharge Attestation: I reviewed the resident's note and agree with the discharge plans and disposition. I personally spent < 30 minutes in discharge planning services.    YEE LAM, MD      -------------------------------------------------------------------------------   Physician Discharge Summary HBR 2 BT1 [facility]BR [address] [place] [address] Dept: [phone] Loc: [phone]   Identifying Information:  Carolyn Andrews 1990-12-26 [id number]  Primary Care Physician: [facility] Lorrane Dunnings D  Code Status: Full Code  Admit Date: 02/23/2024  Discharge Date: 02/25/2024   Discharge To: Home  Discharge Service: HBR - FAM - Blue   Discharge Attending Physician: Vernell FORBES Remington, MD  Discharge Diagnoses: Principal Problem:   Diabetic ketoacidosis without coma associated with type 2 diabetes mellitus (CMS-HCC) Active Problems:   Uncontrolled type 2 diabetes mellitus with hyperglycemia (CMS-HCC)   Chronic hypertension   HLD (hyperlipidemia)   CKD (chronic kidney disease)   Anemia   Herpes zoster without complication   FSGS (focal segmental glomerulosclerosis)   Outpatient Provider Follow Up Issues:  [ ]  FU blood sugars on new regimen of 40 units glargine nightly and 1500 mg metformin   [ ]  FU blood pressures with addition of nifedipine 60mg    Hospital Course:  Carolyn Andrews is a 33 y.o. female whose presentation is complicated byPMHx of T2DM, HTN, HLD, and CKD that presented to [facility] with symptomatic hyperglycemia to 642 at outpatient pharmacist appointment.   # hyperglycemia with anion gap  T2DM  The patient was admitted with symptomatic hyperglycemia, with an initial blood glucose of 733 mg/dL and an anion gap of 17. Arterial blood gas revealed a pH of 7.37 and pCO2 of 45, with no  evidence of acidosis on serial VBGs. The clinical picture was consistent with hyperglycemia without diabetic ketoacidosis (DKA), in the setting of poorly controlled type 2 diabetes mellitus (A1c >14% on 10/02). The patient was started on insulin  via [employer], with subsequent closure of the anion gap. Home insulin  glargine 30 units nightly was resumed and increased to 40 units at discharge. Metformin  was increased from 750 mg to 1500 mg daily. Ozempic was held while inpatient but restarted on discharge. Blood glucose levels remained elevated but improved over the course of admission (360 PM, 328 and 302 AM on day of discharge). The patient had follow up scheduled with a primary care provider 2 days after discharge. The patient was instructed to check her sugars regularly and to present to care if her sugars approach the 500's range or if she is symptomatic.   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 The rash followed a dermatomal distribution, did not cross the midline, and was described as burning in nature. Gabapentin 100 mg daily and a lidocaine  patch were initiated, with significant improvement in pain. Gabapentin was continued on discharge for ongoing neuropathic pain management.  #Hypertension  Hypertension was managed by increasing home nifedipine to 60 mg nightly, based on outpatient pharmacy documentation. Chlorthalidone 25 mg and lasix 40mg  were held during admission due to active potassium repletion, but were restarted on discharge. During admission Valsartan 80 mg was initiated as a formulary alternative to olmesartan 40 mg.   #Hyperlipidemia  Home rosuvastatin 10 mg was continued.    At the time of admission, there was an expectation that the patient was going to be hospitalized greater than two midnights, but the patient improved  faster than expected prior to a two midnight stay because of  rapid improvement of hyperglycemia and electrolyte abnormalities.    Touchbase with Outpatient Provider: Warm Handoff: Completed on 02/25/24 by Harrie CHRISTELLA Dellen, MD  (Intern) via [facility] Message  Procedures: None No admission procedures for hospital encounter. ______________________________________________________________________ Discharge Medications:   Your Medication List     PAUSE taking these medications    ergocalciferol-1,250 mcg (50,000 unit) 1,250 mcg (50,000 unit) capsule Wait to take this until your doctor or other care provider tells you to start again. Commonly known as: DRISDOL Take 1 capsule (1,250 mcg total) by mouth once a week for 12 doses. Ask about: Should I take this medication?       START taking these medications    gabapentin 100 MG capsule Commonly known as: NEURONTIN Take 1 capsule (100 mg total) by mouth Three (3) times a day.       CHANGE how you take these medications    insulin  glargine 100 unit/mL injection Commonly known as: LANTUS Inject 0.4 mL (40 Units total) under the skin daily. What changed: how much to take   metFORMIN  750 MG 24 hr tablet Commonly known as: GLUCOPHAGE -XR Take 2 tablets (1,500 mg total) by mouth daily. Start taking on: February 26, 2024 What changed:  how much to take when to take this   NIFEdipine 60 MG 24 hr tablet Commonly known as: PROCARDIA XL Take 1 tablet (60 mg total) by mouth daily. Start taking on: February 26, 2024 What changed:  medication strength how much to take       CONTINUE taking these medications    chlorthalidone 25 MG tablet Commonly known as: HYGROTON Take 1 tablet (25 mg total) by mouth every morning.   DEXCOM G7 RECEIVER Misc Generic drug: blood-glucose,receiver,cont Use to check sugars with sensors.   DEXCOM G7 SENSOR Devi Generic drug: blood-glucose sensor Replace sensor every 10 days   diclofenac sodium 1 % gel Commonly known as: VOLTAREN Apply 2 g topically  four (4) times a day.   famotidine 40 MG tablet Commonly known as: PEPCID Take 1 tablet (40 mg total) by mouth daily.   furosemide 20 MG tablet Commonly known as: LASIX Take 2 tablets (40 mg total) by mouth in the evening. TAKE 2 TABLETS OF 20MG  BY MOUTH IN THE MORNING. MAY TAKE AN EXTRA DOSE ABOUT 6 HOURS LATER IF SWELLING PERSISTS.   magnesium  hydroxide 400 mg/5 mL Susp Commonly known as: magnesium  hydroxide Take 15 mL by mouth daily as needed. Can take if no bowel movement with scheduled senna & Miralax    olmesartan 20 MG tablet Commonly known as: BENICAR Take 2 tablets (40 mg total) by mouth in the morning.   OZEMPIC 0.25 mg or 0.5 mg (2 mg/3 mL) Pnij Generic drug: semaglutide Inject 0.25 mg under the skin once weekly x 4 weeks, then increase to 0.5 mg once weekly   pen needle, diabetic 32 gauge x 5/32 (4 mm) Ndle Use to administer insulin  once daily       ASK your doctor about these medications    polyethylene glycol 17 gram packet Commonly known as: MIRALAX  Take 17 g by mouth in the morning.   rosuvastatin 10 MG tablet Commonly known as: CRESTOR Take 1 tablet (10 mg total) by mouth before bedtime.   senna 8.6 mg tablet Commonly known as: SENNA Take 1 tablet by mouth two (2) times a day.        Allergies: Lisinopril ______________________________________________________________________ Pending Test  Results (if blank, then none): Pending Labs     Order Current Status   Blood Culture #1 Preliminary result   Blood Culture #2 Preliminary result   Urine Culture Preliminary result       Most Recent Labs: All lab results last 24 hours -  Recent Results (from the past 24 hours)  POCT Glucose   Collection Time: 02/24/24 12:53 PM  Result Value Ref Range   Glucose, POC 211 (H) 70 - 179 mg/dL  Endotool   Collection Time: 02/24/24 12:57 PM  Result Value Ref Range   Endotool Glucose 211 (H) 140 - 180 MG/DL   Endotool Insulin  Rate 12 <50 UNITS/HOUR    Endotool Next Glucose 02/24/24 12:58:01 TIME  POCT Glucose   Collection Time: 02/24/24  5:04 PM  Result Value Ref Range   Glucose, POC 226 (H) 70 - 179 mg/dL  POCT Glucose   Collection Time: 02/24/24  9:18 PM  Result Value Ref Range   Glucose, POC 360 (H) 70 - 179 mg/dL  Blood Gas, Venous   Collection Time: 02/25/24  5:42 AM  Result Value Ref Range   Specimen Source Venous    FIO2 Venous Room Air    pH, Venous 7.41 7.32 - 7.43   pCO2, Ven 41 40 - 60 mm Hg   pO2, Ven 52 (H) 35 - 40 mm Hg   HCO3, Ven 25 22 - 27 mmol/L   Base Excess, Ven 1.3 -2.0 - 2.0   O2 Saturation, Venous 87.1 (H) 40.0 - 85.0 %  CBC   Collection Time: 02/25/24  5:42 AM  Result Value Ref Range   WBC 6.8 3.6 - 11.2 10*9/L   RBC 3.89 (L) 3.95 - 5.13 10*12/L   HGB 11.5 11.3 - 14.9 g/dL   HCT 67.1 (L) 65.9 - 55.9 %   MCV 84.4 77.6 - 95.7 fL   MCH 29.6 25.9 - 32.4 pg   MCHC 35.0 32.0 - 36.0 g/dL   RDW 86.5 87.7 - 84.7 %   MPV 9.2 6.8 - 10.7 fL   Platelet 223 150 - 450 10*9/L  Basic Metabolic Panel   Collection Time: 02/25/24  5:42 AM  Result Value Ref Range   Sodium 141 135 - 145 mmol/L   Potassium 4.1 3.5 - 5.1 mmol/L   Chloride 102 98 - 107 mmol/L   CO2 24.9 20.0 - 31.0 mmol/L   Anion Gap 14 5 - 14 mmol/L   BUN 11 9 - 23 mg/dL   Creatinine 9.35 9.44 - 1.02 mg/dL   BUN/Creatinine Ratio 17    eGFR CKD-EPI (2021) Female >90 >=60 mL/min/1.72m2   Glucose 363 (H) 70 - 179 mg/dL   Calcium 8.7 8.7 - 89.5 mg/dL  Magnesium  Level   Collection Time: 02/25/24  5:42 AM  Result Value Ref Range   Magnesium  1.9 1.6 - 2.6 mg/dL  Phosphorus Level   Collection Time: 02/25/24  5:42 AM  Result Value Ref Range   Phosphorus 4.4 2.4 - 5.1 mg/dL  POCT Glucose   Collection Time: 02/25/24  6:11 AM  Result Value Ref Range   Glucose, POC 328 (H) 70 - 179 mg/dL  POCT Glucose   Collection Time: 02/25/24  7:51 AM  Result Value Ref Range   Glucose, POC 302 (H) 70 - 179 mg/dL    Relevant Studies/Radiology (if blank, then  none): ECG 12 Lead Result Date: 02/23/2024 NORMAL SINUS RHYTHM MINIMAL VOLTAGE CRITERIA FOR LVH, MAY BE NORMAL VARIANT ( R in aVL )  CANNOT RULE OUT ANTERIOR INFARCT  , AGE UNDETERMINED ABNORMAL ECG WHEN COMPARED WITH ECG OF 28-Sep-2023 14:56, NONSPECIFIC T WAVE ABNORMALITY NOW EVIDENT IN ANTEROLATERAL LEADS Confirmed by Von Shawl [id number]) on 02/23/2024 8:42:49 PM  ______________________________________________________________________ Discharge Instructions:  Activity Instructions     Activity as tolerated           Other Instructions     Call MD for:  difficulty breathing, headache or visual disturbances     Call MD for:  extreme fatigue     Call MD for:  hives     Call MD for:  persistent dizziness or light-headedness     Call MD for:  persistent nausea or vomiting     Call MD for:  severe uncontrolled pain     Call MD for: Temperature > 38.5 Celsius ( > 101.3 Fahrenheit)     Discharge instructions     You were admitted to [facility] because your blood sugar was very high. While you were here, we gave you IV fluids, corrected your blood sugar, and replaced some of your electrolytes. We increased your nightly insulin  dose to 40 units and doubled your metformin  to 1500 mg per day. You should start taking both of these at home, along with your Ozempic. Please check your blood sugars regularly using your Dexcom. If your blood sugar starts getting close to 500, or if you have symptoms of high blood sugar you should seek medical care right away. Symptoms to watch for include: Feeling very thirsty, Urinating more than usual, Feeling very tired or weak, Nausea or vomiting, Stomach pain, Confusion or dizziness. Your Dexcom may not be accurate for the first 24-48 hours after starting it, so use finger sticks to double-check your blood sugar during that time.  We also increased your nifedipine from 30mg  to 60 mg because your outpatient provider recommended this. You should start  taking the higher dose. You can continue taking the rest of your home medications as usual.    Please carefully read and follow these instructions below upon your discharge:  1) Please take your medications as prescribed and note the changes listed on your discharge. At future follow-up appointments, please be sure to take all of your medications with you so your provider can better guide your care.   2) Seek medical care with your primary care doctor or local Emergency Room or Urgent Care if you develop any changes in your mental status, worsening abdominal pain, fevers greater than 101.5, any unexplained/unrelieved shortness of breath, uncontrolled nausea and vomiting that keeps you from remaining hydrated or taking your medication, or any other concerning symptoms.   3) Please go to your follow-up appointments. Some of your follow-up appointments have been listed below. If you do not see an appointment listed below with your primary care doctor, please call your doctor's office as soon as possible to schedule an appointment to be seen within 7-10 days of discharge.   4) If you have any concerns before you are able to follow-up with your primary care doctor, you can reach us  by calling [phone] and asking to page the resident on call.          Follow Up instructions and Outpatient Referrals    Call MD for:  difficulty breathing, headache or visual disturbances     Call MD for:  extreme fatigue     Call MD for:  hives     Call MD for:  persistent dizziness or light-headedness  Call MD for:  persistent nausea or vomiting     Call MD for:  severe uncontrolled pain     Call MD for: Temperature > 38.5 Celsius ( > 101.3 Fahrenheit)     Discharge instructions       Appointments which have been scheduled for you    Mar 05, 2024 10:00 AM (Arrive by 9:45 AM) RETURN NEPHROLOGY with Redell JAYSON Dory, DO [facility] [place] [facility] REGION) [address] [facility] [address] [place] [address] [phone]     Mar 05, 2024 11:00 AM (Arrive by 10:45 AM) NEW NUTRITION with Dagoberto DELENA Glance, RD/LDN [facility] NUTRITION [facility] SPECIALTY [facility]ANSPL[facility] [address]LO[address] [place] [facility] REGION) [address] [place] [address] [phone]     Mar 15, 2024 11:00 AM (Arrive by 10:45 AM) RETURN VIDEO VISIT MYCHART with Connee Richerd Ina, CPP [facility] [place] [facility] REGION) [address] [facility] [address] [place] [address] [phone]  Please sign into My [facility] Chart at least 15 minutes before your appointment to complete the eCheck-In process. You must complete eCheck-In before you can start your video visit. We also recommend testing your audio and video connection to troubleshoot any issues before your visit begins. Click "Join Video Visit" to complete these checks. Once you have completed eCheck-In and tested your audio and video, click "Join Call" to connect to your visit.   For your video visit, you will need a computer with a working camera, speaker and microphone, a smartphone, or a tablet with internet access.  My [facility] Chart enables you to manage your health, send non-urgent messages to your provider, view your test results, schedule and manage appointments, and request prescription refills securely and conveniently from your computer or mobile device.  You can go to [email or web address] to sign in to your [facility] Chart account with your username and password. If you have forgotten your username or password, please choose the "Forgot Username?" and/or "Forgot Password?" links to gain access. You also can access your [facility] Chart account with the free MyChart mobile app for Android or iPhone.  If you need assistance accessing your [facility] Chart account or for assistance in reaching your provider's office to  reschedule or cancel your appointment, please call [facility] [phone].        ______________________________________________________________________ Discharge Day Services: BP 133/85   Pulse 78   Temp 36.5 C (97.7 F) (Axillary)   Resp 12   Ht 152.4 cm (5')   Wt 84.4 kg (186 lb 1.1 oz)   SpO2 98%   BMI 36.34 kg/m  Pt seen on the day of discharge and determined appropriate for discharge.  Gen: NAD, converses  HENT: atraumatic, normocephalic Heart: RRR Lungs: CTAB, no crackles or wheezes Abdomen: soft, NTND, small painful crusted red rash in a dermatomal distribution over the RLQ Extremities: No lower extremity edema   Condition at Discharge: good  Length of Discharge: I spent less than 30 mins in the discharge of this patient.

## 2024-03-14 ENCOUNTER — Other Ambulatory Visit: Payer: Self-pay

## 2024-03-14 ENCOUNTER — Observation Stay
Admission: EM | Admit: 2024-03-14 | Discharge: 2024-03-15 | Disposition: A | Discharge status: Home / Self Care | Attending: Emergency Medicine | Admitting: Emergency Medicine

## 2024-03-14 ENCOUNTER — Emergency Department

## 2024-03-14 DIAGNOSIS — R1110 Vomiting, unspecified: Secondary | ICD-10-CM

## 2024-03-14 DIAGNOSIS — B0222 Postherpetic trigeminal neuralgia: Secondary | ICD-10-CM | POA: Diagnosis not present

## 2024-03-14 DIAGNOSIS — R7889 Finding of other specified substances, not normally found in blood: Secondary | ICD-10-CM

## 2024-03-14 DIAGNOSIS — E1165 Type 2 diabetes mellitus with hyperglycemia: Secondary | ICD-10-CM | POA: Diagnosis not present

## 2024-03-14 DIAGNOSIS — Z6837 Body mass index (BMI) 37.0-37.9, adult: Secondary | ICD-10-CM | POA: Diagnosis not present

## 2024-03-14 DIAGNOSIS — E66813 Obesity, class 3: Secondary | ICD-10-CM | POA: Insufficient documentation

## 2024-03-14 DIAGNOSIS — I10 Essential (primary) hypertension: Secondary | ICD-10-CM | POA: Diagnosis not present

## 2024-03-14 DIAGNOSIS — Z79899 Other long term (current) drug therapy: Secondary | ICD-10-CM | POA: Diagnosis not present

## 2024-03-14 DIAGNOSIS — Z794 Long term (current) use of insulin: Secondary | ICD-10-CM | POA: Insufficient documentation

## 2024-03-14 DIAGNOSIS — E1110 Type 2 diabetes mellitus with ketoacidosis without coma: Secondary | ICD-10-CM | POA: Diagnosis not present

## 2024-03-14 DIAGNOSIS — R112 Nausea with vomiting, unspecified: Principal | ICD-10-CM | POA: Insufficient documentation

## 2024-03-14 DIAGNOSIS — R0789 Other chest pain: Secondary | ICD-10-CM | POA: Diagnosis not present

## 2024-03-14 DIAGNOSIS — R079 Chest pain, unspecified: Secondary | ICD-10-CM | POA: Diagnosis not present

## 2024-03-14 DIAGNOSIS — B0229 Other postherpetic nervous system involvement: Secondary | ICD-10-CM | POA: Insufficient documentation

## 2024-03-14 LAB — BASIC METABOLIC PANEL WITH GFR
Anion gap: 20 — ABNORMAL HIGH (ref 5–15)
Anion gap: 18 — ABNORMAL HIGH (ref 5–15)
BUN: 20 mg/dL (ref 6–20)
BUN: 19 mg/dL (ref 6–20)
CO2: 23 mmol/L (ref 22–32)
CO2: 23 mmol/L (ref 22–32)
Calcium: 9.6 mg/dL (ref 8.9–10.3)
Calcium: 9.3 mg/dL (ref 8.9–10.3)
Chloride: 91 mmol/L — ABNORMAL LOW (ref 98–111)
Chloride: 95 mmol/L — ABNORMAL LOW (ref 98–111)
Creatinine, Ser: 0.94 mg/dL (ref 0.44–1.00)
Creatinine, Ser: 0.94 mg/dL (ref 0.44–1.00)
GFR, Estimated: >60 mL/min (ref >60)
GFR, Estimated: >60 mL/min (ref >60)
Glucose, Bld: 528 mg/dL (ref 70–99)
Glucose, Bld: 418 mg/dL — ABNORMAL HIGH (ref 70–99)
Potassium: 4 mmol/L (ref 3.5–5.1)
Potassium: 4.1 mmol/L (ref 3.5–5.1)
Sodium: 134 mmol/L — ABNORMAL LOW (ref 135–145)
Sodium: 136 mmol/L (ref 135–145)

## 2024-03-14 LAB — ABO/RH: ABO/RH(D): O POS

## 2024-03-14 LAB — BLOOD GAS, VENOUS
Acid-Base Excess: 4 mmol/L — ABNORMAL HIGH (ref 0.0–2.0)
Bicarbonate: 29.2 mmol/L — ABNORMAL HIGH (ref 20.0–28.0)
O2 Saturation: 72.3 %
Patient temperature: 37
pCO2, Ven: 45 mmHg (ref 44–60)
pH, Ven: 7.42 (ref 7.25–7.43)
pO2, Ven: 41 mmHg (ref 32–45)

## 2024-03-14 LAB — TROPONIN I (HIGH SENSITIVITY)
Troponin I (High Sensitivity): 8 ng/L (ref <18)
Troponin I (High Sensitivity): 7 ng/L (ref <18)

## 2024-03-14 LAB — HEPATIC FUNCTION PANEL
ALT: 32 U/L (ref 0–44)
AST: 36 U/L (ref 15–41)
Albumin: 2.8 g/dL — ABNORMAL LOW (ref 3.5–5.0)
Alkaline Phosphatase: 82 U/L (ref 38–126)
Bilirubin, Direct: <0.1 mg/dL (ref 0.0–0.2)
Total Bilirubin: 1.2 mg/dL (ref 0.0–1.2)
Total Protein: 7.4 g/dL (ref 6.5–8.1)

## 2024-03-14 LAB — CBC
HCT: 37.7 % (ref 36.0–46.0)
Hemoglobin: 12.9 g/dL (ref 12.0–15.0)
MCH: 28.6 pg (ref 26.0–34.0)
MCHC: 34.2 g/dL (ref 30.0–36.0)
MCV: 83.6 fL (ref 80.0–100.0)
Platelets: 338 K/uL (ref 150–400)
RBC: 4.51 MIL/uL (ref 3.87–5.11)
RDW: 11.8 % (ref 11.5–15.5)
WBC: 9.4 K/uL (ref 4.0–10.5)
nRBC: 0 % (ref 0.0–0.2)

## 2024-03-14 LAB — BETA-HYDROXYBUTYRIC ACID: Beta-Hydroxybutyric Acid: 5.49 mmol/L — ABNORMAL HIGH (ref 0.05–0.27)

## 2024-03-14 LAB — CBG MONITORING, ED
Glucose-Capillary: 411 mg/dL — ABNORMAL HIGH (ref 70–99)
Glucose-Capillary: 380 mg/dL — ABNORMAL HIGH (ref 70–99)

## 2024-03-14 LAB — HCG, QUANTITATIVE, PREGNANCY: hCG, Beta Chain, Quant, S: 1 m[IU]/mL (ref <5)

## 2024-03-14 LAB — LIPASE, BLOOD: Lipase: 29 U/L (ref 11–51)

## 2024-03-14 MED ORDER — LACTATED RINGERS IV BOLUS
1000.0000 mL | Freq: Once | INTRAVENOUS | Status: AC
  Administered 2024-03-14: 1000 mL via INTRAVENOUS

## 2024-03-14 MED ORDER — SODIUM CHLORIDE 0.9 % IV BOLUS: 1000.0000 mL | Freq: Once | INTRAVENOUS | Status: DC

## 2024-03-14 MED ORDER — POTASSIUM CHLORIDE 10 MEQ/100ML IV SOLN
10.0000 meq | INTRAVENOUS | Status: AC
  Administered 2024-03-14 – 2024-03-15 (×2): 10 meq via INTRAVENOUS
  Filled 2024-03-14 (×2): qty 100

## 2024-03-14 MED ORDER — LACTATED RINGERS IV SOLN: INTRAVENOUS | Status: DC

## 2024-03-14 MED ORDER — ENOXAPARIN SODIUM 60 MG/0.6ML IJ SOSY
0.5000 mg/kg | PREFILLED_SYRINGE | INTRAMUSCULAR | Status: DC
  Administered 2024-03-15: 42.5 mg via SUBCUTANEOUS
  Filled 2024-03-14: qty 0.6

## 2024-03-14 MED ORDER — METOCLOPRAMIDE HCL 5 MG/ML IJ SOLN
10.0000 mg | Freq: Four times a day (QID) | INTRAMUSCULAR | Status: AC
  Administered 2024-03-15 (×3): 10 mg via INTRAVENOUS
  Filled 2024-03-14 (×3): qty 2

## 2024-03-14 MED ORDER — PANTOPRAZOLE SODIUM 40 MG IV SOLR
40.0000 mg | INTRAVENOUS | Status: DC
  Administered 2024-03-15: 40 mg via INTRAVENOUS
  Filled 2024-03-14: qty 10

## 2024-03-14 MED ORDER — ONDANSETRON HCL 4 MG/2ML IJ SOLN
4.0000 mg | Freq: Once | INTRAMUSCULAR | Status: AC
  Administered 2024-03-14: 4 mg via INTRAVENOUS
  Filled 2024-03-14: qty 2

## 2024-03-14 MED ORDER — DEXTROSE 50 % IV SOLN: 0.0000 mL | INTRAVENOUS | Status: DC | PRN

## 2024-03-14 MED ORDER — INSULIN ASPART 100 UNIT/ML IJ SOLN
5.0000 [IU] | Freq: Once | INTRAMUSCULAR | Status: AC
  Administered 2024-03-14: 5 [IU] via INTRAVENOUS
  Filled 2024-03-14: qty 5

## 2024-03-14 MED ORDER — INSULIN REGULAR(HUMAN) IN NACL 100-0.9 UT/100ML-% IV SOLN
INTRAVENOUS | Status: DC
  Administered 2024-03-14: 17 [IU]/h via INTRAVENOUS
  Administered 2024-03-15: 4.8 [IU]/h via INTRAVENOUS
  Filled 2024-03-14 (×2): qty 100

## 2024-03-14 MED ORDER — PROMETHAZINE HCL 25 MG/ML IJ SOLN
12.5000 mg | Freq: Once | INTRAMUSCULAR | Status: AC
  Administered 2024-03-14: 12.5 mg via INTRAVENOUS
  Filled 2024-03-14: qty 12.5

## 2024-03-14 MED ORDER — MORPHINE SULFATE (PF) 4 MG/ML IV SOLN
4.0000 mg | Freq: Once | INTRAVENOUS | Status: AC
  Administered 2024-03-14: 4 mg via INTRAVENOUS
  Filled 2024-03-14: qty 1

## 2024-03-14 MED ORDER — DEXTROSE IN LACTATED RINGERS 5 % IV SOLN: INTRAVENOUS | Status: DC

## 2024-03-14 MED ORDER — HYDRALAZINE HCL 20 MG/ML IJ SOLN: 5.0000 mg | INTRAMUSCULAR | Status: DC | PRN

## 2024-03-14 NOTE — Assessment & Plan Note (Signed)
 IV hydralazine as needed until able to tolerate orally

## 2024-03-14 NOTE — Assessment & Plan Note (Signed)
 BMI 37.11 kg/m with comorbidities Complicating factor to overall prognosis and care

## 2024-03-14 NOTE — ED Triage Notes (Addendum)
 Pt arrives via POV with c/o non-radiating centralized chest tightness that started this morning. Pt also endorses emesis (3-4x today) that started this morning. Pt is 3 mo pregnant but did experience bleeding last night like it was their second day of their period. Pt is A&Ox4 and ambulatory during triage.

## 2024-03-14 NOTE — Assessment & Plan Note (Signed)
 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

## 2024-03-14 NOTE — ED Provider Notes (Signed)
 [facility] Provider Note    Event Date/Time   First MD Initiated Contact with Patient 03/14/24 1647     (approximate)   History   Chest Pain   HPI  Carolyn Andrews is a 33 year old female with history of T2DM, HTN presenting to the emergency department for evaluation of chest pain and vomiting.  Patient reports that symptoms started this morning.  Reports multiple episodes of nonbloody vomiting.  Reports a sharp pain in the center of her chest.  Denies history of similar.  Reports she has not taken her diabetes medication today in the setting of feeling poorly.  Triage note reports the patient is 3 months pregnant, but she clarifies that she has irregular periods, is unsure when she is pregnant, does not currently think that she is pregnant.     Physical Exam   Triage Vital Signs: ED Triage Vitals  Encounter Vitals Group     BP 03/14/24 1338 (!) 159/85     Girls Systolic BP Percentile --      Girls Diastolic BP Percentile --      Boys Systolic BP Percentile --      Boys Diastolic BP Percentile --      Pulse Rate 03/14/24 1338 72     Resp 03/14/24 1338 18     Temp 03/14/24 1338 97.7 F (36.5 C)     Temp Source 03/14/24 1338 Oral     SpO2 03/14/24 1338 100 %     Weight 03/14/24 1343 190 lb (86.2 kg)     Height 03/14/24 1343 5' (1.524 m)     Head Circumference --      Peak Flow --      Pain Score 03/14/24 1341 9     Pain Loc --      Pain Education --      Exclude from Growth Chart --     Most recent vital signs: Vitals:   03/14/24 1930 03/14/24 2000  BP: (!) 142/93 (!) 167/84  Pulse: (!) 102 90  Resp:  17  Temp:  98.3 F (36.8 C)  SpO2: 99% 98%     General: Awake, interactive  CV:  Good peripheral perfusion Resp:  Unlabored respirations, lungs clear to auscultations Chest wall:  Reproducible tenderness over the anterior chest wall Abd:  Nondistended, soft, no significant tenderness to palpation Neuro:  Symmetric facial  movement, fluid speech, moving extremities spontaneously and equally    ED Results / Procedures / Treatments   Labs (all labs ordered are listed, but only abnormal results are displayed) Labs Reviewed  BASIC METABOLIC PANEL WITH GFR - Abnormal; Notable for the following components:      Result Value   Sodium 134 (*)    Chloride 91 (*)    Glucose, Bld 528 (*)    Anion gap 20 (*)    All other components within normal limits  BLOOD GAS, VENOUS - Abnormal; Notable for the following components:   Bicarbonate 29.2 (*)    Acid-Base Excess 4.0 (*)    All other components within normal limits  BETA-HYDROXYBUTYRIC ACID - Abnormal; Notable for the following components:   Beta-Hydroxybutyric Acid 5.49 (*)    All other components within normal limits  HEPATIC FUNCTION PANEL - Abnormal; Notable for the following components:   Albumin 2.8 (*)    All other components within normal limits  CBG MONITORING, ED - Abnormal; Notable for the following components:   Glucose-Capillary 411 (*)    All  other components within normal limits  CBC  HCG, QUANTITATIVE, PREGNANCY  LIPASE, BLOOD  POC URINE PREG, ED  ABO/RH  TROPONIN I (HIGH SENSITIVITY)  TROPONIN I (HIGH SENSITIVITY)     EKG EKG independently reviewed and interpreted by myself demonstrates:  EKG demonstrates normal sinus rhythm at a rate of 76, PR 112, QRS 84, QTc 411, no acute ST changes  RADIOLOGY Imaging independently reviewed and interpreted by myself demonstrates:  CXR without focal consolidation  Formal Radiology Read:  DG Chest 2 View Result Date: 03/14/2024 EXAM: 2 VIEW(S) XRAY OF THE CHEST 03/14/2024 01:58:52 PM COMPARISON: 09/27/2023 CLINICAL HISTORY: CP. Pt coming to hospital with really bad chest pains. Pt denies being pregnant and states she may have had a miscarriage. FINDINGS: LUNGS AND PLEURA: Underinflation. No focal pulmonary opacity. No pulmonary edema. No pleural effusion. No pneumothorax. HEART AND MEDIASTINUM: No  acute abnormality of the cardiac and mediastinal silhouettes. BONES AND SOFT TISSUES: No acute osseous abnormality. DIAPHRAGMS AND UPPER ABDOMEN: Air-fluid level in stomach beneath left hemidiaphragm. IMPRESSION: 1. No acute cardiopulmonary process identified. Electronically signed by: Waddell Calk MD 03/14/2024 02:29 PM EDT RP Workstation: [id number]    PROCEDURES:  Critical Care performed: Yes, see critical care procedure note(s)  CRITICAL CARE Performed by: Nilsa Dade   Total critical care time: 32 minutes  Critical care time was exclusive of separately billable procedures and treating other patients.  Critical care was necessary to treat or prevent imminent or life-threatening deterioration.  Critical care was time spent personally by me on the following activities: development of treatment plan with patient and/or surrogate as well as nursing, discussions with consultants, evaluation of patient's response to treatment, examination of patient, obtaining history from patient or surrogate, ordering and performing treatments and interventions, ordering and review of laboratory studies, ordering and review of radiographic studies, pulse oximetry and re-evaluation of patient's condition.   Procedures   MEDICATIONS ORDERED IN ED: Medications  insulin  regular, human (MYXREDLIN) 100 units/ 100 mL infusion (has no administration in time range)  lactated ringers infusion (has no administration in time range)  dextrose 5 % in lactated ringers infusion (has no administration in time range)  dextrose 50 % solution 0-50 mL (has no administration in time range)  potassium chloride 10 mEq in 100 mL IVPB (has no administration in time range)  ondansetron  (ZOFRAN ) injection 4 mg (4 mg Intravenous Given 03/14/24 1747)  morphine  (PF) 4 MG/ML injection 4 mg (4 mg Intravenous Given 03/14/24 1747)  insulin  aspart (novoLOG ) injection 5 Units (5 Units Intravenous Given 03/14/24 1747)  lactated ringers  bolus 1,000 mL (0 mLs Intravenous Stopped 03/14/24 2131)  promethazine  (PHENERGAN ) 12.5 mg in sodium chloride  0.9 % 50 mL IVPB (0 mg Intravenous Stopped 03/14/24 2030)  lactated ringers bolus 1,000 mL (1,000 mLs Intravenous New Bag/Given 03/14/24 2133)  ondansetron  (ZOFRAN ) injection 4 mg (4 mg Intravenous Given 03/14/24 2133)     IMPRESSION / MDM / ASSESSMENT AND PLAN / ED COURSE  I reviewed the triage vital signs and the nursing notes.  Differential diagnosis includes, but is not limited to, anemia, electrolyte abnormality, pancreatitis, ACS, low risk PE and PERC negative, DKA, hyperglycemia without DKA, low suspicion acute intra-abdominal process given reassuring abdominal exam, no intracranial complaints, focal deficits suggestive of intracranial etiology of vomiting  Patient's presentation is most consistent with acute presentation with potential threat to life or bodily function.  33 year old female presenting to the emergency department for evaluation of vomiting and chest pain.  Stable vitals on  presentation.  Labs from triage notable for hyperglycemia with glucose of 528.  Anion gap elevated, but normal bicarb.  Pregnancy test negative.  Will obtain LFTs, lipase to further evaluate.  Will treat symptomatically and reassess.  With reassuring abdominal exam, do not think there is an indication for abdominal imaging currently.  Chest x-Marki Frede reassuring.  EKG without acute ischemic changes.  Clinical Course as of 03/14/24 2229  Wed Mar 14, 2024  1936 Reassessed-recurrent vomiting despite medications.  Will trial Phenergan .  If remains with refractory vomiting will likely require admission. [NR]  2127 After Phenergan  patient transiently felt improved, but did attempt to drink water with recurrent vomiting.  Repeat BGL remains significantly elevated at 411.  With her refractory vomiting and borderline DKA, do think she is appropriate for admission.  Will reach out to hospitalist team. [NR]  2217  Case discussed with Dr. Cleatus.  She will evaluate for anticipated admission.  With borderline criteria for DKA with elevated beta hydroxybutyric acid, elevated anion gap, did recommend initiating Endo tool which has been ordered. [NR]    Clinical Course User Index [NR] Levander Slate, MD     FINAL CLINICAL IMPRESSION(S) / ED DIAGNOSES   Final diagnoses:  Hyperglycemia due to diabetes mellitus (HCC)  Intractable vomiting  Elevated beta-hydroxybutyrate     Rx / DC Orders   ED Discharge Orders     None        Note:  This document was prepared using Dragon voice recognition software and may include unintentional dictation errors.   Levander Slate, MD 03/14/24 2229

## 2024-03-14 NOTE — Assessment & Plan Note (Signed)
 Continue home meds of gabapentin and lidocaine  patch

## 2024-03-14 NOTE — Assessment & Plan Note (Addendum)
 History of poorly controlled diabetes, recent hospitalization for DKA, (A1c> 14% 02/23/24) IV fluids, IV insulin  and electrolyte replacement per Endo tool Pharmacy consulted to assist with electrolyte replacement Transition to subcu insulin  when gap is closed Can consider discontinuation of Ozempic, or decreased dose at discharge

## 2024-03-14 NOTE — Progress Notes (Signed)
 Anticoagulation monitoring(Lovenox):  33 yo female ordered Lovenox 40 mg Q24h    Filed Weights   03/14/24 1343  Weight: 86.2 kg (190 lb)   BMI 37.11    Lab Results  Component Value Date   CREATININE 0.94 03/14/2024   CREATININE 0.82 12/25/2023   CREATININE 0.75 09/27/2023   Estimated Creatinine Clearance: 81.5 mL/min (by C-G formula based on SCr of 0.94 mg/dL). Hemoglobin & Hematocrit     Component Value Date/Time   HGB 12.9 03/14/2024 1345   HGB 15.0 09/30/2011 2304   HCT 37.7 03/14/2024 1345   HCT 45.1 09/30/2011 2304     Per Protocol for Patient with estCrcl > 30 ml/min and BMI > 30, will transition to Lovenox 42.5 mg Q24h.

## 2024-03-14 NOTE — H&P (Signed)
 History and Physical    Patient: Carolyn Andrews FMW:[id number] DOB: 1991-02-15 DOA: 03/14/2024 DOS: the patient was seen and examined on 03/14/2024 PCP: [facility]  Patient coming from: Home  Chief Complaint:  Chief Complaint  Patient presents with   Chest Pain    HPI: Carolyn Andrews is a 33 y.o. female with medical history significant for  HTN, HLD, morbid obesity, recent postherpetic neuralgia on gabapentin and lidocaine  patch, poorly controlled diabetes(A1c 02/23/2024 >14%), with recent hospitalization at [facility] for DKA (10/2-10/4//25) being admitted with intractable vomiting as well as DKA/HHS.  Patient was discharged with resumption of her home insulin  glargine, increased dose of metformin  from 750 to 1500 mg daily, and states her Ozempic was also uptitrated after initially being held while hospitalized.  She denies abdominal pain, change in bowel habits, fever or chills.  She denies prior episode of intractable vomiting. In the ED, BP 159/85 with otherwise normal vitals. Labs notable for glucose 528 with a gap of 20 but normal bicarb of 23.  Beta hydroxybutyric acid elevated at 5.49 with normal venous pH of 7.42.  Other labs including CBC, troponin, quantitative hCG and hepatic function panel for the most part within normal limits. EKG with NSR at 76 Chest x-ray nonacute. Patient initially treated with 1.5 L bolus fluid and given 5 units IV insulin  with some improvement in blood glucose to 411.  She received multiple antiemetics but failed and oral challenge and continued to actively vomiting in the ED. He was started on IV insulin  via Endo tool Admission requested     Review of Systems: As mentioned in the history of present illness. All other systems reviewed and are negative.  Past Medical History:  Diagnosis Date   Constipation    Diabetes (HCC)    type 2: metformin , Lantus, Ozempic   Hypertension    Proteinuria    has been going on for  awhile   Past Surgical History:  Procedure Laterality Date   COLONOSCOPY WITH PROPOFOL  N/A 05/04/2023   Procedure: COLONOSCOPY WITH PROPOFOL ;  Surgeon: Jinny Carmine, MD;  Location: [facility] ENDOSCOPY;  Service: Endoscopy;  Laterality: N/A;   Social History:  reports that she has never smoked. She has never used smokeless tobacco. She reports that she does not drink alcohol and does not use drugs.  Allergies  Allergen Reactions   Lisinopril Swelling    Family History  Problem Relation Age of Onset   Diabetes Mother     Prior to Admission medications   Medication Sig Start Date End Date Taking? Authorizing Provider  amLODipine (NORVASC) 10 MG tablet Take 10 mg by mouth daily. 04/06/23   [provider]  dicyclomine  (BENTYL ) 20 MG tablet Take 1 tablet (20 mg total) by mouth every 8 (eight) hours as needed. 12/26/23   Ward, Josette SAILOR, DO  HUMALOG KWIKPEN 100 UNIT/ML KwikPen Inject 5 Units into the skin 3 (three) times daily. 04/06/23   [provider]  lactulose  (CHRONULAC ) 10 GM/15ML solution SMARTSIG:Milliliter(s) By Mouth 04/08/23   [provider]  linaclotide  (LINZESS ) 290 MCG CAPS capsule Take 1 capsule (290 mcg total) by mouth daily before breakfast. 05/24/23   Jinny Carmine, MD  metFORMIN  (GLUCOPHAGE ) 500 MG tablet Take 500 mg by mouth 2 (two) times daily. 11/15/22   [provider]  ondansetron  (ZOFRAN -ODT) 4 MG disintegrating tablet Take 1 tablet (4 mg total) by mouth every 6 (six) hours as needed for nausea or vomiting. 12/26/23   Ward, Josette SAILOR, DO  pantoprazole  (PROTONIX ) 40 MG tablet Take 1 tablet (40 mg total) by mouth daily. 12/26/23 12/25/24  Ward, Josette SAILOR, DO  rosuvastatin (CRESTOR) 10 MG tablet Take 10 mg by mouth at bedtime. 11/15/22   [provider]  senna-docusate (SENOKOT-S) 8.6-50 MG tablet Take 2 tablets by mouth daily. 03/23/23   [provider]    Physical Exam: Vitals:   03/14/24 1840 03/14/24 1900 03/14/24 1930  03/14/24 2000  BP: (!) 141/71 (!) 155/85 (!) 142/93 (!) 167/84  Pulse: 96 89 (!) 102 90  Resp:    17  Temp:    98.3 F (36.8 C)  TempSrc:    Oral  SpO2: 97% 98% 99% 98%  Weight:      Height:       Physical Exam Vitals and nursing note reviewed.  Constitutional:      General: She is not in acute distress. HENT:     Head: Normocephalic and atraumatic.  Cardiovascular:     Rate and Rhythm: Normal rate and regular rhythm.     Heart sounds: Normal heart sounds.  Pulmonary:     Effort: Pulmonary effort is normal.     Breath sounds: Normal breath sounds.  Abdominal:     Palpations: Abdomen is soft.     Tenderness: There is no abdominal tenderness.  Neurological:     Mental Status: Mental status is at baseline.     Labs on Admission: I have personally reviewed following labs and imaging studies  CBC: Recent Labs  Lab 03/14/24 1345  WBC 9.4  HGB 12.9  HCT 37.7  MCV 83.6  PLT 338   Basic Metabolic Panel: Recent Labs  Lab 03/14/24 1345  NA 134*  K 4.0  CL 91*  CO2 23  GLUCOSE 528*  BUN 20  CREATININE 0.94  CALCIUM 9.6   GFR: Estimated Creatinine Clearance: 81.5 mL/min (by C-G formula based on SCr of 0.94 mg/dL). Liver Function Tests: Recent Labs  Lab 03/14/24 1342  AST 36  ALT 32  ALKPHOS 82  BILITOT 1.2  PROT 7.4  ALBUMIN 2.8*   Recent Labs  Lab 03/14/24 1342  LIPASE 29   No results for input(s): AMMONIA in the last 168 hours. Coagulation Profile: No results for input(s): INR, PROTIME in the last 168 hours. Cardiac Enzymes: No results for input(s): CKTOTAL, CKMB, CKMBINDEX, TROPONINI in the last 168 hours. BNP (last 3 results) No results for input(s): PROBNP in the last 8760 hours. HbA1C: No results for input(s): HGBA1C in the last 72 hours. CBG: Recent Labs  Lab 03/14/24 2121  GLUCAP 411*   Lipid Profile: No results for input(s): CHOL, HDL, LDLCALC, TRIG, CHOLHDL, LDLDIRECT in the last 72 hours. Thyroid  Function Tests: No results for input(s): TSH, T4TOTAL, FREET4, T3FREE, THYROIDAB in the last 72 hours. Anemia Panel: No results for input(s): VITAMINB12, FOLATE, FERRITIN, TIBC, IRON, RETICCTPCT in the last 72 hours. Urine analysis:    Component Value Date/Time   COLORURINE YELLOW (A) 12/26/2023 0100   APPEARANCEUR HAZY (A) 12/26/2023 0100   APPEARANCEUR Clear 09/30/2011 2304   LABSPEC 1.022 12/26/2023 0100   LABSPEC 1.029 09/30/2011 2304   PHURINE 5.0 12/26/2023 0100   GLUCOSEU >=500 (A) 12/26/2023 0100   GLUCOSEU >=500 09/30/2011 2304   HGBUR NEGATIVE 12/26/2023 0100   BILIRUBINUR NEGATIVE 12/26/2023 0100   BILIRUBINUR Negative 09/30/2011 2304   KETONESUR NEGATIVE 12/26/2023 0100   PROTEINUR >=300 (A) 12/26/2023 0100   NITRITE NEGATIVE 12/26/2023 0100   LEUKOCYTESUR NEGATIVE 12/26/2023 0100  LEUKOCYTESUR Trace 09/30/2011 2304    Radiological Exams on Admission: DG Chest 2 View Result Date: 03/14/2024 EXAM: 2 VIEW(S) XRAY OF THE CHEST 03/14/2024 01:58:52 PM COMPARISON: 09/27/2023 CLINICAL HISTORY: CP. Pt coming to hospital with really bad chest pains. Pt denies being pregnant and states she may have had a miscarriage. FINDINGS: LUNGS AND PLEURA: Underinflation. No focal pulmonary opacity. No pulmonary edema. No pleural effusion. No pneumothorax. HEART AND MEDIASTINUM: No acute abnormality of the cardiac and mediastinal silhouettes. BONES AND SOFT TISSUES: No acute osseous abnormality. DIAPHRAGMS AND UPPER ABDOMEN: Air-fluid level in stomach beneath left hemidiaphragm. IMPRESSION: 1. No acute cardiopulmonary process identified. Electronically signed by: Waddell Calk MD 03/14/2024 02:29 PM EDT RP Workstation: [id number]   Data Reviewed for HPI: Relevant notes from primary care and specialist visits, past discharge summaries as available in EHR, including Care Everywhere. Prior diagnostic testing as pertinent to current admission diagnoses Updated medications  and problem lists for reconciliation ED course, including vitals, labs, imaging, treatment and response to treatment Triage notes, nursing and pharmacy notes and ED provider's notes Notable results as noted above in HPI      Assessment and Plan: * Intractable vomiting with nausea Possible medication adverse effect-Ozempic Possible diabetic gastroparesis Acute gastritis less likely as no abdominal pain IV hydration IV antiemetics, IV Protonix -standing dose Reglan x 2 doses Sips of clear liquids Can consider GI consult if not improving   Diabetic ketoacidosis without coma associated with type 2 diabetes mellitus (HCC) History of poorly controlled diabetes, recent hospitalization for DKA, (A1c> 14% 02/23/24) IV fluids, IV insulin  and electrolyte replacement per Endo tool Pharmacy consulted to assist with electrolyte replacement Transition to subcu insulin  when gap is closed Can consider discontinuation of Ozempic, or decreased dose at discharge  Post herpetic neuralgia Continue home meds of gabapentin and lidocaine  patch  Obesity, Class III, BMI 40-49.9 (morbid obesity) (HCC) BMI 37.11 kg/m with comorbidities Complicating factor to overall prognosis and care  Chronic hypertension IV hydralazine as needed until able to tolerate orally        DVT prophylaxis: Lovenox  Consults: none  Advance Care Planning: full code  Family Communication: none  Disposition Plan: Back to previous home environment  Severity of Illness: The appropriate patient status for this patient is INPATIENT. Inpatient status is judged to be reasonable and necessary in order to provide the required intensity of service to ensure the patient's safety. The patient's presenting symptoms, physical exam findings, and initial radiographic and laboratory data in the context of their chronic comorbidities is felt to place them at high risk for further clinical deterioration. Furthermore, it is not  anticipated that the patient will be medically stable for discharge from the hospital within 2 midnights of admission.   * I certify that at the point of admission it is my clinical judgment that the patient will require inpatient hospital care spanning beyond 2 midnights from the point of admission due to high intensity of service, high risk for further deterioration and high frequency of surveillance required.*  Author: Delayne LULLA Solian, MD 03/14/2024 10:29 PM  For on call review [email or web address].

## 2024-03-14 NOTE — ED Notes (Signed)
 Labs sent

## 2024-03-15 DIAGNOSIS — R112 Nausea with vomiting, unspecified: Secondary | ICD-10-CM | POA: Diagnosis not present

## 2024-03-15 DIAGNOSIS — E1165 Type 2 diabetes mellitus with hyperglycemia: Secondary | ICD-10-CM | POA: Diagnosis not present

## 2024-03-15 DIAGNOSIS — R1110 Vomiting, unspecified: Secondary | ICD-10-CM

## 2024-03-15 DIAGNOSIS — R7889 Finding of other specified substances, not normally found in blood: Secondary | ICD-10-CM

## 2024-03-15 LAB — BASIC METABOLIC PANEL WITH GFR
Anion gap: 12 (ref 5–15)
Anion gap: 8 (ref 5–15)
Anion gap: 12 (ref 5–15)
BUN: 20 mg/dL (ref 6–20)
BUN: 19 mg/dL (ref 6–20)
BUN: 19 mg/dL (ref 6–20)
CO2: 26 mmol/L (ref 22–32)
CO2: 28 mmol/L (ref 22–32)
CO2: 28 mmol/L (ref 22–32)
Calcium: 8.7 mg/dL — ABNORMAL LOW (ref 8.9–10.3)
Calcium: 8.5 mg/dL — ABNORMAL LOW (ref 8.9–10.3)
Calcium: 8.8 mg/dL — ABNORMAL LOW (ref 8.9–10.3)
Chloride: 99 mmol/L (ref 98–111)
Chloride: 101 mmol/L (ref 98–111)
Chloride: 99 mmol/L (ref 98–111)
Creatinine, Ser: 0.87 mg/dL (ref 0.44–1.00)
Creatinine, Ser: 0.81 mg/dL (ref 0.44–1.00)
Creatinine, Ser: 0.9 mg/dL (ref 0.44–1.00)
GFR, Estimated: >60 mL/min (ref >60)
GFR, Estimated: >60 mL/min (ref >60)
GFR, Estimated: >60 mL/min (ref >60)
Glucose, Bld: 258 mg/dL — ABNORMAL HIGH (ref 70–99)
Glucose, Bld: 199 mg/dL — ABNORMAL HIGH (ref 70–99)
Glucose, Bld: 209 mg/dL — ABNORMAL HIGH (ref 70–99)
Potassium: 3.5 mmol/L (ref 3.5–5.1)
Potassium: 3.5 mmol/L (ref 3.5–5.1)
Potassium: 3.7 mmol/L (ref 3.5–5.1)
Sodium: 137 mmol/L (ref 135–145)
Sodium: 137 mmol/L (ref 135–145)
Sodium: 139 mmol/L (ref 135–145)

## 2024-03-15 LAB — CBG MONITORING, ED
Glucose-Capillary: 165 mg/dL — ABNORMAL HIGH (ref 70–99)
Glucose-Capillary: 165 mg/dL — ABNORMAL HIGH (ref 70–99)
Glucose-Capillary: 170 mg/dL — ABNORMAL HIGH (ref 70–99)
Glucose-Capillary: 172 mg/dL — ABNORMAL HIGH (ref 70–99)
Glucose-Capillary: 186 mg/dL — ABNORMAL HIGH (ref 70–99)
Glucose-Capillary: 187 mg/dL — ABNORMAL HIGH (ref 70–99)
Glucose-Capillary: 232 mg/dL — ABNORMAL HIGH (ref 70–99)
Glucose-Capillary: 233 mg/dL — ABNORMAL HIGH (ref 70–99)
Glucose-Capillary: 241 mg/dL — ABNORMAL HIGH (ref 70–99)
Glucose-Capillary: 264 mg/dL — ABNORMAL HIGH (ref 70–99)
Glucose-Capillary: 325 mg/dL — ABNORMAL HIGH (ref 70–99)

## 2024-03-15 LAB — BETA-HYDROXYBUTYRIC ACID
Beta-Hydroxybutyric Acid: 6.06 mmol/L — ABNORMAL HIGH (ref 0.05–0.27)
Beta-Hydroxybutyric Acid: 1.67 mmol/L — ABNORMAL HIGH (ref 0.05–0.27)
Beta-Hydroxybutyric Acid: 0.27 mmol/L (ref 0.05–0.27)
Beta-Hydroxybutyric Acid: 0.14 mmol/L (ref 0.05–0.27)

## 2024-03-15 LAB — HIV ANTIBODY (ROUTINE TESTING W REFLEX): HIV Screen 4th Generation wRfx: NONREACTIVE

## 2024-03-15 MED ORDER — INSULIN ASPART 100 UNIT/ML IJ SOLN
0.0000 [IU] | Freq: Three times a day (TID) | INTRAMUSCULAR | Status: DC
  Administered 2024-03-15: 5 [IU] via SUBCUTANEOUS
  Filled 2024-03-15: qty 5

## 2024-03-15 MED ORDER — INSULIN ASPART 100 UNIT/ML IJ SOLN: 0.0000 [IU] | Freq: Every day | INTRAMUSCULAR | Status: DC

## 2024-03-15 MED ORDER — POTASSIUM CHLORIDE 10 MEQ/100ML IV SOLN
10.0000 meq | INTRAVENOUS | Status: AC
  Administered 2024-03-15 (×2): 10 meq via INTRAVENOUS
  Filled 2024-03-15 (×2): qty 100

## 2024-03-15 MED ORDER — INSULIN GLARGINE-YFGN 100 UNIT/ML ~~LOC~~ SOLN
30.0000 [IU] | Freq: Every day | SUBCUTANEOUS | Status: DC
  Administered 2024-03-15: 30 [IU] via SUBCUTANEOUS
  Filled 2024-03-15: qty 0.3

## 2024-03-15 NOTE — Inpatient Diabetes Management (Addendum)
 Inpatient Diabetes Program Recommendations  AACE/ADA: New Consensus Statement on Inpatient Glycemic Control (2015)  Target Ranges:  Prepandial:   less than 140 mg/dL      Peak postprandial:   less than 180 mg/dL (1-2 hours)      Critically ill patients:  140 - 180 mg/dL    Latest Reference Range & Units 03/14/24 16:47 03/14/24 23:04 03/15/24 02:32  Beta-Hydroxybutyric Acid 0.05 - 0.27 mmol/L 5.49 (H) 6.06 (H) 1.67 (H)  (H): Data is abnormally high  Latest Reference Range & Units 03/14/24 13:45  Glucose 70 - 99 mg/dL 471 (HH)  (HH): Data is critically high  Latest Reference Range & Units 03/14/24 21:21 03/14/24 23:08 03/15/24 00:11 03/15/24 01:25 03/15/24 02:28 03/15/24 04:40 03/15/24 05:51  Glucose-Capillary 70 - 99 mg/dL 588 (H) 619 (H)  IV Insulin  Drip Started 325 (H) 264 (H) 241 (H) 186 (H) 165 (H)  IV Insulin  Drip Running  (H): Data is abnormally high   Admit with:  Intractable vomiting with nausea Possible medication adverse effect-Ozempic Possible diabetic gastroparesis Diabetic ketoacidosis without coma associated with type 2 diabetes mellitus  Recent hospitalization at [facility] for DKA (10/2-10/4/25)--intractable vomiting as well as DKA/HHS.   Patient was discharged with resumption of her home insulin  glargine, increased dose of metformin  from 750 to 1500 mg daily, and states her Ozempic was also uptitrated after initially being held while hospitalized.   History: DM2  Home DM Meds: Humalog 5 units TID       Metformin  500 mg BID (Notes from [facility] state dose was increased to 1500 mg daily)       Lantus??  (Notes from [facility] state dose was increased to 40 units at HS)          Ozempic?       Dexcom G7 CGM??  Current Orders: IV Insulin  Drip    MD- Note 2:32am BMET shows Anion Gap down to 12 and CO2 up to 20 Next BMET due this AM  If next BMET and BHB level are WNL, may consider transition to SQ Insulin   Recommend: 1. Start Lantus 30 units daily (0.35 units/kg)--Can d/c IV  Insulin  Drip 2 hours after Lantus on board  2. Start Novolog  Moderate Correction Scale/ SSI (0-15 units) TID AC + HS    Addendum 10:45am--Met w/ pt down in the ED.  She was A&O and appropriate for conversation.  Reviewed last admission at [facility] with pt and current admission here for DKA.  Discussed with patient diagnosis of DKA (pathophysiology), treatment of DKA, lab results, and transition plan to SQ insulin  regimen.  Pt told me she has been taking Lantus 40 units daily, Metformin  1500 mg daily (two 750 mg pills) + Ozempic 0.25 mg Qweek.  Looking for new PCP but has been mainly seeing/using Nephrologist for visits and refills on meds.  Has meds at home.  Uses the Dexcom G7 CGM for glucose monitoring.  Plans to restart new Dexcom when she goes home--Uses her phone to see glucose levels with the Dexcom.  I reviewed healthy glucose goals and healthy A1c level for home.  Also reviewed appropriate DM diet for home.  Pt appreciative of visit and did not have any questions for me at this time.        --Will follow patient during hospitalization--  Adina Rudolpho Arrow RN, MSN, CDCES Diabetes Coordinator Inpatient Glycemic Control Team Team Pager: [phone] (8a-5p)

## 2024-03-15 NOTE — ED Notes (Signed)
 Pt given breakfast meal and is eating same

## 2024-03-15 NOTE — ED Notes (Addendum)
 Error with prior note.

## 2024-03-15 NOTE — Progress Notes (Signed)
 PHARMACY CONSULT NOTE - FOLLOW UP  Pharmacy Consult for Electrolyte Monitoring and Replacement   Recent Labs: Potassium (mmol/L)  Date Value  03/15/2024 3.5  10/01/2011 3.7   Calcium (mg/dL)  Date Value  89/76/7974 8.5 (L)   Calcium, Total (mg/dL)  Date Value  94/89/7986 7.8 (L)   Albumin (g/dL)  Date Value  89/77/7974 2.8 (L)   Sodium (mmol/L)  Date Value  03/15/2024 137  10/01/2011 137     Assessment: 33 y.o. female w/ PMH of  HTN, HLD, morbid obesity, recent postherpetic neuralgia on gabapentin and lidocaine  patch, poorly controlled diabetes(A1c 02/23/2024 >14%), with recent hospitalization at [facility] for DKA (10/2-10/4//25) being admitted with intractable vomiting as well as DKA/HHS. Pharmacy is asked to follow and replace electrolytes. She remains on an insulin  infusion at this time  Goal of Therapy:  Electrolytes WNL  Plan:  ---10 mEq IV KCl x 2 ---BMP every 4 hours while on IV insulin   Adriana JONETTA Bolster ,PharmD Clinical Pharmacist 03/15/2024 8:31 AM

## 2024-03-16 LAB — HEMOGLOBIN A1C
Hgb A1c MFr Bld: >15.5 % — ABNORMAL HIGH (ref 4.8–5.6)
Mean Plasma Glucose: >398 mg/dL

## 2024-03-16 NOTE — Telephone Encounter (Signed)
 Called patient to discuss potentially starting Kerendia for anti-proteinuric effects. However, would need more up-date to blood work. Patient reports that she was recently DC from the hospital after presenting with chest pain, N/V, & poor PO intake thought to be 2/2 from recently starting Ozempic. Patient today reports that she is feeling well & is planning on restarting Ozempic.  I have asked that she go to her nearest [facility] next week to repeat K & Cr to determine if safe to potentially start on Kerendia.  All other questions answered.  Redell MOTE Atanacio, D.O. PGY-V Nephrology and Hypertension

## 2024-03-17 ENCOUNTER — Other Ambulatory Visit: Payer: Self-pay

## 2024-03-17 ENCOUNTER — Inpatient Hospital Stay
Admission: EM | Admit: 2024-03-17 | Discharge: 2024-03-20 | DRG: 074 | Disposition: A | Discharge status: Home / Self Care | Attending: Internal Medicine | Admitting: Internal Medicine

## 2024-03-17 ENCOUNTER — Emergency Department

## 2024-03-17 DIAGNOSIS — Z888 Allergy status to other drugs, medicaments and biological substances status: Secondary | ICD-10-CM

## 2024-03-17 DIAGNOSIS — R112 Nausea with vomiting, unspecified: Principal | ICD-10-CM | POA: Diagnosis present

## 2024-03-17 DIAGNOSIS — R0789 Other chest pain: Secondary | ICD-10-CM | POA: Diagnosis not present

## 2024-03-17 DIAGNOSIS — Z79899 Other long term (current) drug therapy: Secondary | ICD-10-CM

## 2024-03-17 DIAGNOSIS — I160 Hypertensive urgency: Secondary | ICD-10-CM | POA: Diagnosis present

## 2024-03-17 DIAGNOSIS — R7889 Finding of other specified substances, not normally found in blood: Secondary | ICD-10-CM | POA: Diagnosis present

## 2024-03-17 DIAGNOSIS — R079 Chest pain, unspecified: Secondary | ICD-10-CM | POA: Diagnosis not present

## 2024-03-17 DIAGNOSIS — E1165 Type 2 diabetes mellitus with hyperglycemia: Secondary | ICD-10-CM | POA: Diagnosis present

## 2024-03-17 DIAGNOSIS — K3189 Other diseases of stomach and duodenum: Secondary | ICD-10-CM | POA: Diagnosis present

## 2024-03-17 DIAGNOSIS — I10 Essential (primary) hypertension: Secondary | ICD-10-CM | POA: Diagnosis present

## 2024-03-17 DIAGNOSIS — E785 Hyperlipidemia, unspecified: Secondary | ICD-10-CM | POA: Diagnosis present

## 2024-03-17 DIAGNOSIS — Z6837 Body mass index (BMI) 37.0-37.9, adult: Secondary | ICD-10-CM

## 2024-03-17 DIAGNOSIS — K5909 Other constipation: Secondary | ICD-10-CM | POA: Diagnosis present

## 2024-03-17 DIAGNOSIS — T383X5A Adverse effect of insulin and oral hypoglycemic [antidiabetic] drugs, initial encounter: Secondary | ICD-10-CM | POA: Diagnosis present

## 2024-03-17 DIAGNOSIS — E119 Type 2 diabetes mellitus without complications: Secondary | ICD-10-CM | POA: Diagnosis not present

## 2024-03-17 DIAGNOSIS — R0682 Tachypnea, not elsewhere classified: Secondary | ICD-10-CM | POA: Diagnosis not present

## 2024-03-17 DIAGNOSIS — Z833 Family history of diabetes mellitus: Secondary | ICD-10-CM

## 2024-03-17 DIAGNOSIS — Z794 Long term (current) use of insulin: Secondary | ICD-10-CM

## 2024-03-17 DIAGNOSIS — R197 Diarrhea, unspecified: Secondary | ICD-10-CM | POA: Diagnosis present

## 2024-03-17 DIAGNOSIS — D649 Anemia, unspecified: Secondary | ICD-10-CM | POA: Diagnosis present

## 2024-03-17 DIAGNOSIS — Z7984 Long term (current) use of oral hypoglycemic drugs: Secondary | ICD-10-CM

## 2024-03-17 DIAGNOSIS — R739 Hyperglycemia, unspecified: Secondary | ICD-10-CM

## 2024-03-17 DIAGNOSIS — O219 Vomiting of pregnancy, unspecified: Secondary | ICD-10-CM | POA: Diagnosis not present

## 2024-03-17 DIAGNOSIS — R1110 Vomiting, unspecified: Secondary | ICD-10-CM | POA: Diagnosis present

## 2024-03-17 DIAGNOSIS — R0989 Other specified symptoms and signs involving the circulatory and respiratory systems: Secondary | ICD-10-CM | POA: Diagnosis not present

## 2024-03-17 DIAGNOSIS — E1143 Type 2 diabetes mellitus with diabetic autonomic (poly)neuropathy: Principal | ICD-10-CM | POA: Diagnosis present

## 2024-03-17 DIAGNOSIS — K3184 Gastroparesis: Secondary | ICD-10-CM | POA: Diagnosis present

## 2024-03-17 DIAGNOSIS — R109 Unspecified abdominal pain: Secondary | ICD-10-CM | POA: Insufficient documentation

## 2024-03-17 LAB — COMPREHENSIVE METABOLIC PANEL WITH GFR
ALT: 24 U/L (ref 0–44)
AST: 25 U/L (ref 15–41)
Albumin: 2.9 g/dL — ABNORMAL LOW (ref 3.5–5.0)
Alkaline Phosphatase: 81 U/L (ref 38–126)
Anion gap: 15 (ref 5–15)
BUN: 17 mg/dL (ref 6–20)
CO2: 20 mmol/L — ABNORMAL LOW (ref 22–32)
Calcium: 9.6 mg/dL (ref 8.9–10.3)
Chloride: 98 mmol/L (ref 98–111)
Creatinine, Ser: 0.75 mg/dL (ref 0.44–1.00)
GFR, Estimated: >60 mL/min (ref >60)
Glucose, Bld: 435 mg/dL — ABNORMAL HIGH (ref 70–99)
Potassium: 4.5 mmol/L (ref 3.5–5.1)
Sodium: 133 mmol/L — ABNORMAL LOW (ref 135–145)
Total Bilirubin: 1 mg/dL (ref 0.0–1.2)
Total Protein: 7.6 g/dL (ref 6.5–8.1)

## 2024-03-17 LAB — LIPASE, BLOOD: Lipase: 46 U/L (ref 11–51)

## 2024-03-17 LAB — URINE DRUG SCREEN, QUALITATIVE (ARMC ONLY)
Amphetamines, Ur Screen: NOT DETECTED
Barbiturates, Ur Screen: NOT DETECTED
Benzodiazepine, Ur Scrn: NOT DETECTED
Cannabinoid 50 Ng, Ur ~~LOC~~: NOT DETECTED
Cocaine Metabolite,Ur ~~LOC~~: NOT DETECTED
MDMA (Ecstasy)Ur Screen: NOT DETECTED
Methadone Scn, Ur: NOT DETECTED
Opiate, Ur Screen: POSITIVE — AB
Phencyclidine (PCP) Ur S: NOT DETECTED
Tricyclic, Ur Screen: NOT DETECTED

## 2024-03-17 LAB — TROPONIN I (HIGH SENSITIVITY): Troponin I (High Sensitivity): 7 ng/L (ref <18)

## 2024-03-17 LAB — BLOOD GAS, VENOUS
Acid-Base Excess: 1.7 mmol/L (ref 0.0–2.0)
Bicarbonate: 24.2 mmol/L (ref 20.0–28.0)
O2 Saturation: 67.1 %
Patient temperature: 37
pCO2, Ven: 31 mmHg — ABNORMAL LOW (ref 44–60)
pH, Ven: 7.5 — ABNORMAL HIGH (ref 7.25–7.43)
pO2, Ven: 37 mmHg (ref 32–45)

## 2024-03-17 LAB — URINALYSIS, ROUTINE W REFLEX MICROSCOPIC
Bacteria, UA: NONE SEEN
Bilirubin Urine: NEGATIVE
Glucose, UA: >=500 mg/dL — AB
Hgb urine dipstick: NEGATIVE
Ketones, ur: 20 mg/dL — AB
Leukocytes,Ua: NEGATIVE
Nitrite: NEGATIVE
Protein, ur: >=300 mg/dL — AB
Specific Gravity, Urine: 1.029 (ref 1.005–1.030)
pH: 7 (ref 5.0–8.0)

## 2024-03-17 LAB — LACTIC ACID, PLASMA: Lactic Acid, Venous: 1.9 mmol/L (ref 0.5–1.9)

## 2024-03-17 LAB — GLUCOSE, CAPILLARY
Glucose-Capillary: 311 mg/dL — ABNORMAL HIGH (ref 70–99)
Glucose-Capillary: 229 mg/dL — ABNORMAL HIGH (ref 70–99)

## 2024-03-17 LAB — CBC
HCT: 39.6 % (ref 36.0–46.0)
Hemoglobin: 13.3 g/dL (ref 12.0–15.0)
MCH: 28.4 pg (ref 26.0–34.0)
MCHC: 33.6 g/dL (ref 30.0–36.0)
MCV: 84.6 fL (ref 80.0–100.0)
Platelets: 285 K/uL (ref 150–400)
RBC: 4.68 MIL/uL (ref 3.87–5.11)
RDW: 11.9 % (ref 11.5–15.5)
WBC: 9.9 K/uL (ref 4.0–10.5)
nRBC: 0 % (ref 0.0–0.2)

## 2024-03-17 LAB — BETA-HYDROXYBUTYRIC ACID: Beta-Hydroxybutyric Acid: 2.07 mmol/L — ABNORMAL HIGH (ref 0.05–0.27)

## 2024-03-17 LAB — HCG, QUANTITATIVE, PREGNANCY: hCG, Beta Chain, Quant, S: 1 m[IU]/mL (ref <5)

## 2024-03-17 MED ORDER — METOCLOPRAMIDE HCL 5 MG/ML IJ SOLN
10.0000 mg | Freq: Once | INTRAMUSCULAR | Status: AC
  Administered 2024-03-17: 10 mg via INTRAVENOUS
  Filled 2024-03-17: qty 2

## 2024-03-17 MED ORDER — INSULIN ASPART 100 UNIT/ML IJ SOLN
0.0000 [IU] | Freq: Every day | INTRAMUSCULAR | Status: DC
  Administered 2024-03-17: 2 [IU] via SUBCUTANEOUS
  Filled 2024-03-17: qty 1

## 2024-03-17 MED ORDER — AMLODIPINE BESYLATE 10 MG PO TABS
10.0000 mg | ORAL_TABLET | Freq: Every day | ORAL | Status: DC
  Administered 2024-03-17 – 2024-03-20 (×4): 10 mg via ORAL
  Filled 2024-03-17 (×3): qty 1
  Filled 2024-03-17: qty 2
  Filled 2024-03-17: qty 1

## 2024-03-17 MED ORDER — FAMOTIDINE IN NACL 20-0.9 MG/50ML-% IV SOLN
20.0000 mg | Freq: Once | INTRAVENOUS | Status: AC
  Administered 2024-03-17: 20 mg via INTRAVENOUS
  Filled 2024-03-17: qty 50

## 2024-03-17 MED ORDER — ROSUVASTATIN CALCIUM 10 MG PO TABS
10.0000 mg | ORAL_TABLET | Freq: Every day | ORAL | Status: DC
  Administered 2024-03-18 – 2024-03-19 (×2): 10 mg via ORAL
  Filled 2024-03-17 (×3): qty 1

## 2024-03-17 MED ORDER — ONDANSETRON HCL 4 MG/2ML IJ SOLN
4.0000 mg | Freq: Once | INTRAMUSCULAR | Status: AC
  Administered 2024-03-17: 4 mg via INTRAVENOUS
  Filled 2024-03-17: qty 2

## 2024-03-17 MED ORDER — ACETAMINOPHEN 325 MG PO TABS
650.0000 mg | ORAL_TABLET | Freq: Four times a day (QID) | ORAL | Status: DC | PRN
  Administered 2024-03-18: 650 mg via ORAL
  Filled 2024-03-17: qty 2

## 2024-03-17 MED ORDER — SODIUM CHLORIDE 0.9 % IV SOLN: INTRAVENOUS | Status: AC

## 2024-03-17 MED ORDER — FAMOTIDINE 20 MG PO TABS
40.0000 mg | ORAL_TABLET | Freq: Every day | ORAL | Status: DC
Start: 2024-03-18 — End: 2024-03-18
  Administered 2024-03-18: 40 mg via ORAL
  Filled 2024-03-17: qty 2

## 2024-03-17 MED ORDER — MELATONIN 5 MG PO TABS: 5.0000 mg | ORAL_TABLET | Freq: Every evening | ORAL | Status: DC | PRN

## 2024-03-17 MED ORDER — INSULIN GLARGINE-YFGN 100 UNIT/ML ~~LOC~~ SOLN
40.0000 [IU] | Freq: Every day | SUBCUTANEOUS | Status: DC
  Administered 2024-03-17 – 2024-03-18 (×2): 40 [IU] via SUBCUTANEOUS
  Filled 2024-03-17 (×4): qty 0.4

## 2024-03-17 MED ORDER — LACTATED RINGERS IV BOLUS
1000.0000 mL | Freq: Once | INTRAVENOUS | Status: AC
  Administered 2024-03-17: 1000 mL via INTRAVENOUS

## 2024-03-17 MED ORDER — METOCLOPRAMIDE HCL 5 MG/ML IJ SOLN
5.0000 mg | Freq: Four times a day (QID) | INTRAMUSCULAR | Status: AC | PRN
  Administered 2024-03-17 – 2024-03-19 (×4): 5 mg via INTRAVENOUS
  Filled 2024-03-17 (×4): qty 2

## 2024-03-17 MED ORDER — MORPHINE SULFATE (PF) 4 MG/ML IV SOLN
4.0000 mg | Freq: Once | INTRAVENOUS | Status: AC
  Administered 2024-03-17: 4 mg via INTRAVENOUS
  Filled 2024-03-17: qty 1

## 2024-03-17 MED ORDER — ENOXAPARIN SODIUM 40 MG/0.4ML IJ SOSY
40.0000 mg | PREFILLED_SYRINGE | INTRAMUSCULAR | Status: DC
  Filled 2024-03-17: qty 0.4

## 2024-03-17 MED ORDER — SENNOSIDES-DOCUSATE SODIUM 8.6-50 MG PO TABS
2.0000 | ORAL_TABLET | Freq: Every day | ORAL | Status: DC
  Administered 2024-03-18 – 2024-03-19 (×2): 2 via ORAL
  Filled 2024-03-17 (×4): qty 2

## 2024-03-17 MED ORDER — PANTOPRAZOLE SODIUM 40 MG PO TBEC
40.0000 mg | DELAYED_RELEASE_TABLET | Freq: Every day | ORAL | Status: DC
  Administered 2024-03-17 – 2024-03-18 (×2): 40 mg via ORAL
  Filled 2024-03-17 (×2): qty 1

## 2024-03-17 MED ORDER — SODIUM CHLORIDE 0.9 % IV SOLN
12.5000 mg | Freq: Four times a day (QID) | INTRAVENOUS | Status: AC | PRN
  Administered 2024-03-17: 12.5 mg via INTRAVENOUS
  Filled 2024-03-17: qty 12.5

## 2024-03-17 MED ORDER — KETOROLAC TROMETHAMINE 15 MG/ML IJ SOLN
15.0000 mg | Freq: Four times a day (QID) | INTRAMUSCULAR | Status: DC | PRN
  Administered 2024-03-17 – 2024-03-18 (×3): 15 mg via INTRAVENOUS
  Filled 2024-03-17 (×3): qty 1

## 2024-03-17 MED ORDER — ACETAMINOPHEN 650 MG RE SUPP: 650.0000 mg | Freq: Four times a day (QID) | RECTAL | Status: DC | PRN

## 2024-03-17 MED ORDER — HYDRALAZINE HCL 20 MG/ML IJ SOLN
5.0000 mg | Freq: Four times a day (QID) | INTRAMUSCULAR | Status: DC | PRN
  Administered 2024-03-17: 5 mg via INTRAVENOUS
  Filled 2024-03-17: qty 1

## 2024-03-17 MED ORDER — INSULIN ASPART 100 UNIT/ML IJ SOLN
0.0000 [IU] | Freq: Three times a day (TID) | INTRAMUSCULAR | Status: DC
  Administered 2024-03-17: 11 [IU] via SUBCUTANEOUS
  Administered 2024-03-18 (×2): 5 [IU] via SUBCUTANEOUS
  Administered 2024-03-18: 8 [IU] via SUBCUTANEOUS
  Administered 2024-03-19 (×2): 3 [IU] via SUBCUTANEOUS
  Administered 2024-03-19: 5 [IU] via SUBCUTANEOUS
  Administered 2024-03-20: 2 [IU] via SUBCUTANEOUS
  Filled 2024-03-17 (×8): qty 1

## 2024-03-17 NOTE — Assessment & Plan Note (Addendum)
 Home amlodipine 10 mg daily resumed Hydralazine 5 mg IV every 6 hours as needed for SBP > 170, 5 days ordered

## 2024-03-17 NOTE — Assessment & Plan Note (Signed)
 -  This complicates overall care and prognosis.

## 2024-03-17 NOTE — Discharge Summary (Signed)
 Physician Discharge Summary   Patient: Carolyn Andrews MRN: [id number] DOB: 01-26-1991  Admit date:     03/14/2024  Discharge date: 03/15/2024  Discharge Physician: Cresencio Fairly   PCP: [facility]   Recommendations at discharge:    F/up with outpt providers as requested  Discharge Diagnoses: Principal Problem:   Intractable vomiting with nausea Active Problems:   Hyperglycemia due to diabetes mellitus (HCC)   Chronic hypertension   Elevated beta-hydroxybutyrate   Diabetic ketoacidosis without coma associated with type 2 diabetes mellitus (HCC)   Obesity, Class III, BMI 40-49.9 (morbid obesity) (HCC)   Post herpetic neuralgia  Hospital Course: Assessment and Plan:  33 y.o. female with medical history significant for  HTN, HLD, morbid obesity, recent postherpetic neuralgia on gabapentin and lidocaine  patch, poorly controlled diabetes(A1c 02/23/2024 >14%), with recent hospitalization at [facility] for DKA (10/2-10/4//25) was admitted with intractable vomiting as well as DKA/HHS.  Patient was discharged with resumption of her home insulin  glargine, increased dose of metformin  from 750 to 1500 mg daily, and states her Ozempic was also uptitrated after initially being held while hospitalized.  Labs notable for glucose 528 with a gap of 20 but normal bicarb of 23.  Beta hydroxybutyric acid elevated at 5.49 with normal venous pH of 7.42.  Other labs including CBC, troponin, quantitative hCG and hepatic function panel for the most part within normal limits. EKG with NSR at 76. Chest x-ray nonacute. Patient initially treated with 1.5 L bolus fluid and given 5 units IV insulin  with some improvement in blood glucose to 411.  She received multiple antiemetics but failed and oral challenge and continued to actively vomiting in the ED. He was started on IV insulin  via Endo tool  * Intractable vomiting with nausea Possible medication adverse effect-Ozempic Possible diabetic  gastroparesis She had no further vomiting while in the Hospital and tolerated diet.    Diabetic ketoacidosis without coma associated with type 2 diabetes mellitus (HCC) History of poorly controlled diabetes, recent hospitalization for DKA, (A1c> 14% 02/23/24) Rapid improvement with insulin  drip and IV hydration. - DKA resolved Transitioned to SQ insulin . DM nurse educated and gave recommendations for home insulin  regimen and sugar monitoring She is in agreement and would like to go home   Post herpetic neuralgia Continue home meds of gabapentin and lidocaine  patch   Obesity, Class III, BMI 40-49.9 (morbid obesity) (HCC) BMI 37.11 kg/m with comorbidities Complicating factor to overall prognosis and care   Hypertension Controlled.        Disposition: Home Diet recommendation:  Discharge Diet Orders (From admission, onward)     Start     Ordered   03/15/24 0000  Diet - low sodium heart healthy        03/15/24 1102           Carb modified diet DISCHARGE MEDICATION: Allergies as of 03/15/2024       Reactions   Lisinopril Swelling        Medication List     STOP taking these medications    Ozempic (0.25 or 0.5 MG/DOSE) 2 MG/1.5ML Sopn Generic drug: Semaglutide(0.25 or 0.5MG /DOS)       TAKE these medications    amLODipine 10 MG tablet Commonly known as: NORVASC Take 10 mg by mouth daily.   chlorthalidone 25 MG tablet Commonly known as: HYGROTON Take 25 mg by mouth daily.   dicyclomine  20 MG tablet Commonly known as: BENTYL  Take 1 tablet (20 mg total) by mouth every 8 (eight)  hours as needed.   famotidine 40 MG tablet Commonly known as: PEPCID Take 40 mg by mouth daily.   furosemide 20 MG tablet Commonly known as: LASIX Take 40 mg by mouth daily.   HumaLOG KwikPen 100 UNIT/ML KwikPen Generic drug: insulin  lispro Inject 5 Units into the skin 3 (three) times daily.   insulin  glargine 100 UNIT/ML injection Commonly known as: LANTUS Inject 40  Units into the skin daily.   lactulose  10 GM/15ML solution Commonly known as: CHRONULAC  SMARTSIG:Milliliter(s) By Mouth   linaclotide  290 MCG Caps capsule Commonly known as: Linzess  Take 1 capsule (290 mcg total) by mouth daily before breakfast.   metFORMIN  750 MG 24 hr tablet Commonly known as: GLUCOPHAGE -XR Take 750 mg by mouth 2 (two) times daily. What changed: Another medication with the same name was removed. Continue taking this medication, and follow the directions you see here.   ondansetron  4 MG disintegrating tablet Commonly known as: ZOFRAN -ODT Take 1 tablet (4 mg total) by mouth every 6 (six) hours as needed for nausea or vomiting.   pantoprazole  40 MG tablet Commonly known as: Protonix  Take 1 tablet (40 mg total) by mouth daily.   rosuvastatin 10 MG tablet Commonly known as: CRESTOR Take 10 mg by mouth at bedtime.   senna-docusate 8.6-50 MG tablet Commonly known as: Senokot-S Take 2 tablets by mouth daily.   Vitamin D (Ergocalciferol) 1.25 MG (50000 UNIT) Caps capsule Commonly known as: DRISDOL Take 50,000 Units by mouth once a week.        Follow-up [employer] [employer] Schedule an appointment as soon as possible for a visit in 1 week(s).   Why: [facility] Discharge F/UP Contact information: [address] [place] [address] [phone]         [facility]. Schedule an appointment as soon as possible for a visit in 1 week(s).   Why: [facility] Discharge F/UP Contact information: [address] [place] [address] [phone]                Discharge Exam: Fredricka Weights   03/14/24 1343  Weight: 86.2 kg   Constitutional:      General: She is not in acute distress. HENT:     Head: Normocephalic and atraumatic.  Cardiovascular:     Rate and Rhythm: Normal rate and regular rhythm.     Heart sounds: Normal heart sounds.  Pulmonary:     Effort: Pulmonary effort is normal.      Breath sounds: Normal breath sounds.  Abdominal:     Palpations: Abdomen is soft.     Tenderness: There is no abdominal tenderness.  Neurological:     Mental Status: Mental status is at baseline.   Condition at discharge: fair  The results of significant diagnostics from this hospitalization (including imaging, microbiology, ancillary and laboratory) are listed below for reference.   Imaging Studies: DG Chest Port 1 View Result Date: 03/17/2024 EXAM: 1 VIEW(S) XRAY OF THE CHEST 03/17/2024 09:56:00 AM COMPARISON: 03/14/2024 CLINICAL HISTORY: Chest pain. Pt to ED for continued vomiting and chest pain after being seen here on 10/22 for same. States she had felt better for one day and started feeling bad again yesterday, worse this morning. Has been vomiting all morning and also endorses SOB. Hx type 2 ; DM. Pt has not checked BG since hospital d/c. Pt is tachypneic. Recent early pregnancy loss. FINDINGS: LUNGS AND PLEURA: Low lung volumes. No focal pulmonary  opacity. No pulmonary edema. No pleural effusion. No pneumothorax. HEART AND MEDIASTINUM: No acute abnormality of the cardiac and mediastinal silhouettes. BONES AND SOFT TISSUES: No acute osseous abnormality. IMPRESSION: 1. No acute cardiopulmonary process. 2. Low lung volumes. Electronically signed by: Waddell Calk MD 03/17/2024 10:23 AM EDT RP Workstation: [id number]   DG Chest 2 View Result Date: 03/14/2024 EXAM: 2 VIEW(S) XRAY OF THE CHEST 03/14/2024 01:58:52 PM COMPARISON: 09/27/2023 CLINICAL HISTORY: CP. Pt coming to hospital with really bad chest pains. Pt denies being pregnant and states she may have had a miscarriage. FINDINGS: LUNGS AND PLEURA: Underinflation. No focal pulmonary opacity. No pulmonary edema. No pleural effusion. No pneumothorax. HEART AND MEDIASTINUM: No acute abnormality of the cardiac and mediastinal silhouettes. BONES AND SOFT TISSUES: No acute osseous abnormality. DIAPHRAGMS AND UPPER ABDOMEN: Air-fluid level in  stomach beneath left hemidiaphragm. IMPRESSION: 1. No acute cardiopulmonary process identified. Electronically signed by: Waddell Calk MD 03/14/2024 02:29 PM EDT RP Workstation: [id number]    Microbiology: Results for orders placed or performed during the hospital encounter of 09/28/23  Resp panel by RT-PCR (RSV, Flu A&B, Covid) Anterior Nasal Swab     Status: Abnormal   Collection Time: 09/27/23  6:53 PM   Specimen: Anterior Nasal Swab  Result Value Ref Range Status   SARS Coronavirus 2 by RT PCR NEGATIVE NEGATIVE Final    Comment: (NOTE) SARS-CoV-2 target nucleic acids are NOT DETECTED.  The SARS-CoV-2 RNA is generally detectable in upper respiratory specimens during the acute phase of infection. The lowest concentration of SARS-CoV-2 viral copies this assay can detect is 138 copies/mL. A negative result does not preclude SARS-Cov-2 infection and should not be used as the sole basis for treatment or other patient management decisions. A negative result may occur with  improper specimen collection/handling, submission of specimen other than nasopharyngeal swab, presence of viral mutation(s) within the areas targeted by this assay, and inadequate number of viral copies(<138 copies/mL). A negative result must be combined with clinical observations, patient history, and epidemiological information. The expected result is Negative.  Fact Sheet for Patients:  bloggercourse.com  Fact Sheet for Healthcare Providers:  seriousbroker.it  This test is no t yet approved or cleared by the [place]  FDA and  has been authorized for detection and/or diagnosis of SARS-CoV-2 by FDA under an Emergency Use Authorization (EUA). This EUA will remain  in effect (meaning this test can be used) for the duration of the COVID-19 declaration under Section 564(b)(1) of the Act, 21 U.S.C.section 360bbb-3(b)(1), unless the authorization is terminated  or  revoked sooner.       Influenza A by PCR NEGATIVE NEGATIVE Final   Influenza B by PCR POSITIVE (A) NEGATIVE Final    Comment: (NOTE) The Xpert Xpress SARS-CoV-2/FLU/RSV plus assay is intended as an aid in the diagnosis of influenza from Nasopharyngeal swab specimens and should not be used as a sole basis for treatment. Nasal washings and aspirates are unacceptable for Xpert Xpress SARS-CoV-2/FLU/RSV testing.  Fact Sheet for Patients: bloggercourse.com  Fact Sheet for Healthcare Providers: seriousbroker.it  This test is not yet approved or cleared by the [place]  FDA and has been authorized for detection and/or diagnosis of SARS-CoV-2 by FDA under an Emergency Use Authorization (EUA). This EUA will remain in effect (meaning this test can be used) for the duration of the COVID-19 declaration under Section 564(b)(1) of the Act, 21 U.S.C. section 360bbb-3(b)(1), unless the authorization is terminated or revoked.     Resp Syncytial Virus by PCR  NEGATIVE NEGATIVE Final    Comment: (NOTE) Fact Sheet for Patients: bloggercourse.com  Fact Sheet for Healthcare Providers: seriousbroker.it  This test is not yet approved or cleared by the [place]  FDA and has been authorized for detection and/or diagnosis of SARS-CoV-2 by FDA under an Emergency Use Authorization (EUA). This EUA will remain in effect (meaning this test can be used) for the duration of the COVID-19 declaration under Section 564(b)(1) of the Act, 21 U.S.C. section 360bbb-3(b)(1), unless the authorization is terminated or revoked.  Performed at [facility], [address] [place], [place] [address]     Labs: CBC: Recent Labs  Lab 03/14/24 1345 03/17/24 0922  WBC 9.4 9.9  HGB 12.9 13.3  HCT 37.7 39.6  MCV 83.6 84.6  PLT 338 285   Basic Metabolic Panel: Recent Labs  Lab 03/14/24 2304  03/15/24 0232 03/15/24 0725 03/15/24 1049 03/17/24 0922  NA 136 137 137 139 133*  K 4.1 3.5 3.5 3.7 4.5  CL 95* 99 101 99 98  CO2 23 26 28 28  20*  GLUCOSE 418* 258* 199* 209* 435*  BUN 19 20 19 19 17   CREATININE 0.94 0.87 0.81 0.90 0.75  CALCIUM 9.3 8.7* 8.5* 8.8* 9.6   Liver Function Tests: Recent Labs  Lab 03/14/24 1342 03/17/24 0922  AST 36 25  ALT 32 24  ALKPHOS 82 81  BILITOT 1.2 1.0  PROT 7.4 7.6  ALBUMIN 2.8* 2.9*   CBG: Recent Labs  Lab 03/15/24 0922 03/15/24 1019 03/15/24 1129 03/15/24 1216 03/17/24 1736  GLUCAP 170* 165* 233* 232* 311*    Discharge time spent: greater than 30 minutes.  Signed: Cresencio Fairly, MD [facility] 03/17/2024

## 2024-03-17 NOTE — Assessment & Plan Note (Signed)
Continue Crestor and Zetia

## 2024-03-17 NOTE — Assessment & Plan Note (Signed)
 Secondary to nausea and vomiting and likely gastroparesis Opioid medication will not be appropriate as this will slow down gastric motility and worsen pain Acetaminophen  650 mg p.o./rectal every 6 hours as needed for mild pain, fever, headache; Toradol  15 mg IV every 6 hours as needed for moderate pain, 1 day ordered

## 2024-03-17 NOTE — Hospital Course (Signed)
 Ms. Tailor Lucking is a 33 year old female with history of morbid obesity, hypertension, insulin -dependent diabetes mellitus type 2, hyperlipidemia, GERD, history of frequent intractable nausea and vomiting admission, who presents ED for chief concerns of nausea and vomiting.  Vitals in the ED showed t of 98, rr was initially 24 it improved to 17, hr 67, blood pressure initially 212/104 it improved to 170/131, SpO2 100% on room air.  Serum sodium is 133, potassium 4.5, chloride 98, bicarb 20, BUN 17, serum creatinine 0.75, eGFR greater than 60, nonfasting glucose 435, WBC 9.9, hemoglobin 13.3, platelets of 284.  Anion gap was 15.  Beta hydroxybutyrate elevated 2.07.  High sensitive troponin was 7.  Lactic acid 1.9.  A1c on 03/15/2024, was greater than 15.5.  ED treatment: Morphine  4 mg IV one-time dose, ondansetron  4 mg IV one-time dose, famotidine 20 mg IV one-time dose, LR 1 L bolus, metoclopramide 10 mg IV one-time dose.

## 2024-03-17 NOTE — Assessment & Plan Note (Addendum)
 Suspect secondary to poorly controlled diabetes mellitus leading to gastroparesis Strict I's and O's Status post LR 1 L bolus Continue with sodium chloride  infusion at 100 mL/h, 1 day ordered Symptomatic support: Metoclopramide 5 mg IV every 6 hours as needed for nausea and vomiting, 3 days ordered; Phenergan  12.5 mg IV every 6 hours as needed for refractory nausea and vomiting, 1 day ordered Check a UDS Urine pregnancy is negative

## 2024-03-17 NOTE — Plan of Care (Signed)

## 2024-03-17 NOTE — ED Triage Notes (Signed)
 Pt to ED for continued vomiting and chest pain after being seen here on 10/22 for same. States she had felt better for one day and started feeling bad again yesterday, worse this morning. Has been vomiting all morning and also endorses SOB. Hx type 2 DM. Pt has not checked BG since hospital d/c. Pt is tachypneic. Recent early pregnancy loss.

## 2024-03-17 NOTE — H&P (Addendum)
 History and Physical   Carolyn Andrews FMW:[id number] DOB: 1990/11/12 DOA: 03/17/2024  PCP: [facility] [employer]  Patient coming from: Home  I have personally briefly reviewed patient's old medical records in [facility] EMR.  Chief Concern: Persistent nausea, vomiting  HPI: Carolyn Andrews is a 33 year old female with history of morbid obesity, hypertension, insulin -dependent diabetes mellitus type 2, hyperlipidemia, GERD, history of frequent intractable nausea and vomiting admission, who presents ED for chief concerns of nausea and vomiting.  Vitals in the ED showed t of 98, rr was initially 24 it improved to 17, hr 67, blood pressure initially 212/104 it improved to 170/131, SpO2 100% on room air.  Serum sodium is 133, potassium 4.5, chloride 98, bicarb 20, BUN 17, serum creatinine 0.75, eGFR greater than 60, nonfasting glucose 435, WBC 9.9, hemoglobin 13.3, platelets of 284.  Anion gap was 15.  Beta hydroxybutyrate elevated 2.07.  High sensitive troponin was 7.  Lactic acid 1.9.  A1c on 03/15/2024, was greater than 15.5.  ED treatment: Morphine  4 mg IV one-time dose, ondansetron  4 mg IV one-time dose, famotidine 20 mg IV one-time dose, LR 1 L bolus, metoclopramide 10 mg IV one-time dose. --------------------------------------- At bedside, patient able to tell me her first and last name, age, location, current calendar year.  She reports she  woke up this morning and started vomiting.  Patient family reports that yesterday she was fine.  She ate and they went out and she took her insulin  yesterday morning.  She denies changes to her diet.  Patient reports yesterday she took her metformin  as normal.  She reports she was just started on insulin  last month  Social history: She lives at home.  She denies tobacco, EtOH, recreational drug use.  ROS: Constitutional: no weight change, no fever ENT/Mouth: no sore throat, no rhinorrhea Eyes: no eye pain, no  vision changes Cardiovascular: no chest pain, no dyspnea,  no edema, no palpitations Respiratory: no cough, no sputum, no wheezing Gastrointestinal: + nausea, + vomiting, no diarrhea, no constipation Genitourinary: no urinary incontinence, no dysuria, no hematuria Musculoskeletal: no arthralgias, no myalgias Skin: no skin lesions, no pruritus, Neuro: + weakness, no loss of consciousness, no syncope Psych: no anxiety, no depression, + decrease appetite Heme/Lymph: no bruising, no bleeding  ED Course: Discussed with EDP, patient requiring hospitalization for chief concerns of intractable nausea and vomiting.  Assessment/Plan  Principal Problem:   Intractable vomiting with nausea Active Problems:   Hyperglycemia due to diabetes mellitus (HCC)   Chronic hypertension   Elevated beta-hydroxybutyrate   Morbid obesity (HCC)   Hyperlipidemia   Abdominal pain   Assessment and Plan:  * Intractable vomiting with nausea Suspect secondary to poorly controlled diabetes mellitus leading to gastroparesis Strict I's and O's Status post LR 1 L bolus Continue with sodium chloride  infusion at 100 mL/h, 1 day ordered Symptomatic support: Metoclopramide 5 mg IV every 6 hours as needed for nausea and vomiting, 3 days ordered; Phenergan  12.5 mg IV every 6 hours as needed for refractory nausea and vomiting, 1 day ordered Check a UDS Urine pregnancy is negative  Hyperlipidemia Rosuvastatin 10 mg nightly resumed  Morbid obesity (HCC) This complicates overall care and prognosis.   Chronic hypertension Home amlodipine 10 mg daily resumed Hydralazine 5 mg IV every 6 hours as needed for SBP > 170, 5 days ordered  Abdominal pain Secondary to nausea and vomiting and likely gastroparesis Opioid medication will not be appropriate as this will slow down gastric  motility and worsen pain Acetaminophen  650 mg p.o./rectal every 6 hours as needed for mild pain, fever, headache; Toradol  15 mg IV every 6 hours  as needed for moderate pain, 1 day ordered  Chart reviewed.   DVT prophylaxis: Enoxaparin Code Status: Full code Diet: N.p.o. with orders to advance as tolerated to clear liquid diet Family Communication: Updated sister and parents at bedside with patient's permission Disposition Plan: Pending clinical course Consults called: None at this time Admission status: MedSurg, observation  Past Medical History:  Diagnosis Date   Constipation    Diabetes (HCC)    type 2: metformin , Lantus, Ozempic   Hypertension    Proteinuria    has been going on for awhile   Past Surgical History:  Procedure Laterality Date   COLONOSCOPY WITH PROPOFOL  N/A 05/04/2023   Procedure: COLONOSCOPY WITH PROPOFOL ;  Surgeon: Jinny Carmine, MD;  Location: [facility] ENDOSCOPY;  Service: Endoscopy;  Laterality: N/A;   Social History:  reports that she has never smoked. She has never used smokeless tobacco. She reports that she does not drink alcohol and does not use drugs.  Allergies  Allergen Reactions   Lisinopril Swelling   Family History  Problem Relation Age of Onset   Diabetes Mother    Family history: Family history reviewed and not pertinent.  Prior to Admission medications   Medication Sig Start Date End Date Taking? Authorizing Provider  amLODipine (NORVASC) 10 MG tablet Take 10 mg by mouth daily. 04/06/23   [provider]  chlorthalidone (HYGROTON) 25 MG tablet Take 25 mg by mouth daily. 12/02/23 01/05/25  [provider]  dicyclomine  (BENTYL ) 20 MG tablet Take 1 tablet (20 mg total) by mouth every 8 (eight) hours as needed. 12/26/23   Ward, Josette SAILOR, DO  famotidine (PEPCID) 40 MG tablet Take 40 mg by mouth daily. 12/29/23   [provider]  furosemide (LASIX) 20 MG tablet Take 40 mg by mouth daily.    [provider]  HUMALOG KWIKPEN 100 UNIT/ML KwikPen Inject 5 Units into the skin 3 (three) times daily. 04/06/23   [provider]  insulin  glargine (LANTUS)  100 UNIT/ML injection Inject 40 Units into the skin daily.    [provider]  lactulose  (CHRONULAC ) 10 GM/15ML solution SMARTSIG:Milliliter(s) By Mouth 04/08/23   [provider]  linaclotide  (LINZESS ) 290 MCG CAPS capsule Take 1 capsule (290 mcg total) by mouth daily before breakfast. 05/24/23   Jinny Carmine, MD  metFORMIN  (GLUCOPHAGE -XR) 750 MG 24 hr tablet Take 750 mg by mouth 2 (two) times daily.    [provider]  ondansetron  (ZOFRAN -ODT) 4 MG disintegrating tablet Take 1 tablet (4 mg total) by mouth every 6 (six) hours as needed for nausea or vomiting. 12/26/23   Ward, Josette SAILOR, DO  pantoprazole  (PROTONIX ) 40 MG tablet Take 1 tablet (40 mg total) by mouth daily. 12/26/23 12/25/24  Ward, Josette SAILOR, DO  rosuvastatin (CRESTOR) 10 MG tablet Take 10 mg by mouth at bedtime. 11/15/22   [provider]  senna-docusate (SENOKOT-S) 8.6-50 MG tablet Take 2 tablets by mouth daily. 03/23/23   [provider]  Vitamin D, Ergocalciferol, (DRISDOL) 1.25 MG (50000 UNIT) CAPS capsule Take 50,000 Units by mouth once a week. 12/06/23   [provider]   Physical Exam: Vitals:   03/17/24 0905 03/17/24 0930 03/17/24 1249 03/17/24 1355  BP:  (!) 170/131 (!) 207/80 (!) 220/85  Pulse:  (!) 55  66  Resp:  14  17  Temp:  98.3 F (36.8 C)  TempSrc:    Oral  SpO2: 100% 100%    Weight:      Height:       Constitutional: appears age-appropriate, NAD, calm Eyes: PERRL, lids and conjunctivae normal ENMT: Mucous membranes are moist. Posterior pharynx clear of any exudate or lesions. Age-appropriate dentition. Hearing appropriate Neck: normal, supple, no masses, no thyromegaly Respiratory: clear to auscultation bilaterally, no wheezing, no crackles. Normal respiratory effort. No accessory muscle use.  Cardiovascular: Regular rate and rhythm, no murmurs / rubs / gallops. No extremity edema. 2+ pedal pulses. No carotid bruits.  Abdomen: Obese abdomen, no tenderness,  no masses palpated, no hepatosplenomegaly. Bowel sounds positive.  Musculoskeletal: no clubbing / cyanosis. No joint deformity upper and lower extremities. Good ROM, no contractures, no atrophy. Normal muscle tone.  Skin: no rashes, lesions, ulcers. No induration Neurologic: Sensation intact. Strength 5/5 in all 4.  Psychiatric: Normal judgment and insight. Alert and oriented x 3. Normal mood.   EKG: independently reviewed, showing sinus rhythm with rate of 56, QTc 417  Chest x-ray on Admission: I personally reviewed and I agree with radiologist reading as below.  DG Chest Port 1 View Result Date: 03/17/2024 EXAM: 1 VIEW(S) XRAY OF THE CHEST 03/17/2024 09:56:00 AM COMPARISON: 03/14/2024 CLINICAL HISTORY: Chest pain. Pt to ED for continued vomiting and chest pain after being seen here on 10/22 for same. States she had felt better for one day and started feeling bad again yesterday, worse this morning. Has been vomiting all morning and also endorses SOB. Hx type 2 ; DM. Pt has not checked BG since hospital d/c. Pt is tachypneic. Recent early pregnancy loss. FINDINGS: LUNGS AND PLEURA: Low lung volumes. No focal pulmonary opacity. No pulmonary edema. No pleural effusion. No pneumothorax. HEART AND MEDIASTINUM: No acute abnormality of the cardiac and mediastinal silhouettes. BONES AND SOFT TISSUES: No acute osseous abnormality. IMPRESSION: 1. No acute cardiopulmonary process. 2. Low lung volumes. Electronically signed by: Waddell Calk MD 03/17/2024 10:23 AM EDT RP Workstation: [id number]   Labs on Admission: I have personally reviewed following labs  CBC: Recent Labs  Lab 03/14/24 1345 03/17/24 0922  WBC 9.4 9.9  HGB 12.9 13.3  HCT 37.7 39.6  MCV 83.6 84.6  PLT 338 285   Basic Metabolic Panel: Recent Labs  Lab 03/14/24 2304 03/15/24 0232 03/15/24 0725 03/15/24 1049 03/17/24 0922  NA 136 137 137 139 133*  K 4.1 3.5 3.5 3.7 4.5  CL 95* 99 101 99 98  CO2 23 26 28 28  20*  GLUCOSE  418* 258* 199* 209* 435*  BUN 19 20 19 19 17   CREATININE 0.94 0.87 0.81 0.90 0.75  CALCIUM 9.3 8.7* 8.5* 8.8* 9.6   GFR: Estimated Creatinine Clearance: 95.6 mL/min (by C-G formula based on SCr of 0.75 mg/dL).  Liver Function Tests: Recent Labs  Lab 03/14/24 1342 03/17/24 0922  AST 36 25  ALT 32 24  ALKPHOS 82 81  BILITOT 1.2 1.0  PROT 7.4 7.6  ALBUMIN 2.8* 2.9*   Recent Labs  Lab 03/14/24 1342 03/17/24 0922  LIPASE 29 46   HbA1C: Recent Labs    03/15/24 1049  HGBA1C >15.5*   CBG: Recent Labs  Lab 03/15/24 0800 03/15/24 0922 03/15/24 1019 03/15/24 1129 03/15/24 1216  GLUCAP 172* 170* 165* 233* 232*   Urine analysis:    Component Value Date/Time   COLORURINE YELLOW (A) 12/26/2023 0100   APPEARANCEUR HAZY (A) 12/26/2023 0100   APPEARANCEUR Clear 09/30/2011 2304  LABSPEC 1.022 12/26/2023 0100   LABSPEC 1.029 09/30/2011 2304   PHURINE 5.0 12/26/2023 0100   GLUCOSEU >=500 (A) 12/26/2023 0100   GLUCOSEU >=500 09/30/2011 2304   HGBUR NEGATIVE 12/26/2023 0100   BILIRUBINUR NEGATIVE 12/26/2023 0100   BILIRUBINUR Negative 09/30/2011 2304   KETONESUR NEGATIVE 12/26/2023 0100   PROTEINUR >=300 (A) 12/26/2023 0100   NITRITE NEGATIVE 12/26/2023 0100   LEUKOCYTESUR NEGATIVE 12/26/2023 0100   LEUKOCYTESUR Trace 09/30/2011 2304   This document was prepared using Dragon Voice Recognition software and may include unintentional dictation errors.  Dr. Sherre [facility]  If 7PM-7AM, please contact overnight-coverage provider If 7AM-7PM, please contact day attending provider [email or web address]  03/17/2024, 2:13 PM

## 2024-03-17 NOTE — ED Provider Notes (Signed)
 [facility] Provider Note    Event Date/Time   First MD Initiated Contact with Patient 03/17/24 [phone]     (approximate)   History   Emesis, Chest Pain, and Shortness of Breath   HPI  Carolyn Andrews is a 33 y.o. female with a history of hypertension, hyperlipidemia, morbid obesity, diabetes, and postherpetic neuralgia who presents with nausea, vomiting, diarrhea, and chest pain.  She denies any abdominal pain.  The symptoms started this morning.  The patient had similar symptoms several days ago and was admitted to the hospital with hyperglycemia.  She was discharged 2 days ago.  She states that she was doing well yesterday, but then the symptoms started again this morning.  I reviewed the past medical records.  The patient was just admitted to the hospitalist service on 10/22 with intractable vomiting and concern for DKA/HHS.   Physical Exam   Triage Vital Signs: ED Triage Vitals [03/17/24 0849]  Encounter Vitals Group     BP      Girls Systolic BP Percentile      Girls Diastolic BP Percentile      Boys Systolic BP Percentile      Boys Diastolic BP Percentile      Pulse      Resp      Temp      Temp src      SpO2      Weight      Height      Head Circumference      Peak Flow      Pain Score 10     Pain Loc      Pain Education      Exclude from Growth Chart     Most recent vital signs: Vitals:   03/17/24 0905 03/17/24 0930  BP:  (!) 170/131  Pulse:  (!) 55  Resp:  14  Temp:    SpO2: 100% 100%     General: Alert, uncomfortable appearing, no distress.  CV:  Good peripheral perfusion.  Resp:  Normal effort.  Lungs CTAB. Abd:  Soft and nontender.  No distention.  Other:  Dry mucous membranes.  No jaundice or scleral icterus.   ED Results / Procedures / Treatments   Labs (all labs ordered are listed, but only abnormal results are displayed) Labs Reviewed  COMPREHENSIVE METABOLIC PANEL WITH GFR - Abnormal; Notable for the  following components:      Result Value   Sodium 133 (*)    CO2 20 (*)    Glucose, Bld 435 (*)    Albumin 2.9 (*)    All other components within normal limits  BETA-HYDROXYBUTYRIC ACID - Abnormal; Notable for the following components:   Beta-Hydroxybutyric Acid 2.07 (*)    All other components within normal limits  BLOOD GAS, VENOUS - Abnormal; Notable for the following components:   pH, Ven 7.5 (*)    pCO2, Ven 31 (*)    All other components within normal limits  LIPASE, BLOOD  CBC  LACTIC ACID, PLASMA  HCG, QUANTITATIVE, PREGNANCY  URINALYSIS, ROUTINE W REFLEX MICROSCOPIC  URINE DRUG SCREEN, QUALITATIVE ([facility] ONLY)  POC URINE PREG, ED  TROPONIN I (HIGH SENSITIVITY)     EKG  ED ECG REPORT I, Waylon Cassis, the attending physician, personally viewed and interpreted this ECG.  Date: 03/17/2024 EKG Time: 0855 Rate: 56 Rhythm: normal sinus rhythm QRS Axis: normal Intervals: normal ST/T Wave abnormalities: Nonspecific ST abnormality Narrative Interpretation: no evidence of acute  ischemia    RADIOLOGY  Chest x-ray: I independently viewed and interpreted the images; there is no focal consolidation or edema  PROCEDURES:  Critical Care performed: No  Procedures   MEDICATIONS ORDERED IN ED: Medications  rosuvastatin (CRESTOR) tablet 10 mg (has no administration in time range)  insulin  glargine-yfgn (SEMGLEE) injection 40 Units (has no administration in time range)  amLODipine (NORVASC) tablet 10 mg (has no administration in time range)  famotidine (PEPCID) tablet 40 mg (has no administration in time range)  pantoprazole  (PROTONIX ) EC tablet 40 mg (has no administration in time range)  senna-docusate (Senokot-S) tablet 2 tablet (has no administration in time range)  acetaminophen  (TYLENOL ) tablet 650 mg (has no administration in time range)    Or  acetaminophen  (TYLENOL ) suppository 650 mg (has no administration in time range)  enoxaparin (LOVENOX) injection  40 mg (has no administration in time range)  metoCLOPramide (REGLAN) injection 5 mg (has no administration in time range)  promethazine  (PHENERGAN ) 12.5 mg in sodium chloride  0.9 % 50 mL IVPB (has no administration in time range)  0.9 %  sodium chloride  infusion (has no administration in time range)  hydrALAZINE (APRESOLINE) injection 5 mg (has no administration in time range)  melatonin tablet 5 mg (has no administration in time range)  insulin  aspart (novoLOG ) injection 0-15 Units (has no administration in time range)  insulin  aspart (novoLOG ) injection 0-5 Units (has no administration in time range)  lactated ringers bolus 1,000 mL (1,000 mLs Intravenous New Bag/Given 03/17/24 1210)  ondansetron  (ZOFRAN ) injection 4 mg (4 mg Intravenous Given 03/17/24 0954)  morphine  (PF) 4 MG/ML injection 4 mg (4 mg Intravenous Given 03/17/24 0954)  famotidine (PEPCID) IVPB 20 mg premix (0 mg Intravenous Stopped 03/17/24 1033)  metoCLOPramide (REGLAN) injection 10 mg (10 mg Intravenous Given 03/17/24 1210)     IMPRESSION / MDM / ASSESSMENT AND PLAN / ED COURSE  I reviewed the triage vital signs and the nursing notes.  33 year old female with PMH as noted above presents with recurrent nausea, vomiting, some diarrhea, as well as chest pain.  She is not significantly hypertensive.  Other vital signs are normal.  She is uncomfortable appearing, actively dry heaving when I examine her.  The abdomen is soft and nontender.  Differential diagnosis includes, but is not limited to, recurrent DKA, HHS, other hyperglycemic crisis, GERD, gastroparesis, pancreatitis.  We will give fluids, analgesia, antiemetic, obtain lab workup, and reassess.  Patient's presentation is most consistent with acute presentation with potential threat to life or bodily function.  The patient is on the cardiac monitor to evaluate for evidence of arrhythmia and/or significant heart rate  changes.  ----------------------------------------- 12:02 PM on 03/17/2024 -----------------------------------------  Lab workup is reassuring.  The patient is hyperglycemic but with a normal anion gap and no evidence of acidosis.  Troponin and lactic are both normal.  CBC shows no leukocytosis.  She had some improvement with Zofran  but is now having recurrent vomiting so I gave IV Reglan.  Family informed me that the patient is 3 months pregnant, however based on her records it sounds like she had a recent pregnancy loss.  hCG is negative.  The patient will need admission for further treatment.  I consulted Dr. Sherre of the hospitalist service; based on our discussion she agrees to evaluate the patient for admission.   FINAL CLINICAL IMPRESSION(S) / ED DIAGNOSES   Final diagnoses:  Intractable nausea and vomiting  Hyperglycemia     Rx / DC Orders   ED  Discharge Orders     None        Note:  This document was prepared using Dragon voice recognition software and may include unintentional dictation errors.    Jacolyn Pae, MD 03/17/24 1228

## 2024-03-17 NOTE — ED Notes (Signed)
 This RN attempted IV stick x2. Second line wont flush and had to be removed

## 2024-03-18 ENCOUNTER — Inpatient Hospital Stay

## 2024-03-18 DIAGNOSIS — R112 Nausea with vomiting, unspecified: Principal | ICD-10-CM | POA: Diagnosis present

## 2024-03-18 DIAGNOSIS — R0789 Other chest pain: Secondary | ICD-10-CM | POA: Diagnosis not present

## 2024-03-18 DIAGNOSIS — D649 Anemia, unspecified: Secondary | ICD-10-CM | POA: Diagnosis not present

## 2024-03-18 DIAGNOSIS — I10 Essential (primary) hypertension: Secondary | ICD-10-CM | POA: Diagnosis not present

## 2024-03-18 DIAGNOSIS — Z888 Allergy status to other drugs, medicaments and biological substances status: Secondary | ICD-10-CM | POA: Diagnosis not present

## 2024-03-18 DIAGNOSIS — E1165 Type 2 diabetes mellitus with hyperglycemia: Secondary | ICD-10-CM

## 2024-03-18 DIAGNOSIS — Z79899 Other long term (current) drug therapy: Secondary | ICD-10-CM | POA: Diagnosis not present

## 2024-03-18 DIAGNOSIS — O219 Vomiting of pregnancy, unspecified: Secondary | ICD-10-CM | POA: Diagnosis not present

## 2024-03-18 DIAGNOSIS — Z794 Long term (current) use of insulin: Secondary | ICD-10-CM | POA: Diagnosis not present

## 2024-03-18 DIAGNOSIS — K3184 Gastroparesis: Secondary | ICD-10-CM | POA: Diagnosis not present

## 2024-03-18 DIAGNOSIS — R7889 Finding of other specified substances, not normally found in blood: Secondary | ICD-10-CM

## 2024-03-18 DIAGNOSIS — E1143 Type 2 diabetes mellitus with diabetic autonomic (poly)neuropathy: Secondary | ICD-10-CM | POA: Diagnosis not present

## 2024-03-18 DIAGNOSIS — R197 Diarrhea, unspecified: Secondary | ICD-10-CM | POA: Diagnosis present

## 2024-03-18 DIAGNOSIS — T383X5A Adverse effect of insulin and oral hypoglycemic [antidiabetic] drugs, initial encounter: Secondary | ICD-10-CM | POA: Diagnosis not present

## 2024-03-18 DIAGNOSIS — I160 Hypertensive urgency: Secondary | ICD-10-CM | POA: Diagnosis not present

## 2024-03-18 DIAGNOSIS — E785 Hyperlipidemia, unspecified: Secondary | ICD-10-CM | POA: Diagnosis not present

## 2024-03-18 DIAGNOSIS — K5909 Other constipation: Secondary | ICD-10-CM | POA: Diagnosis present

## 2024-03-18 DIAGNOSIS — Z7984 Long term (current) use of oral hypoglycemic drugs: Secondary | ICD-10-CM | POA: Diagnosis not present

## 2024-03-18 DIAGNOSIS — K3189 Other diseases of stomach and duodenum: Secondary | ICD-10-CM | POA: Diagnosis not present

## 2024-03-18 DIAGNOSIS — Z833 Family history of diabetes mellitus: Secondary | ICD-10-CM | POA: Diagnosis not present

## 2024-03-18 DIAGNOSIS — Z6837 Body mass index (BMI) 37.0-37.9, adult: Secondary | ICD-10-CM | POA: Diagnosis not present

## 2024-03-18 DIAGNOSIS — R739 Hyperglycemia, unspecified: Secondary | ICD-10-CM | POA: Diagnosis not present

## 2024-03-18 LAB — COMPREHENSIVE METABOLIC PANEL WITH GFR
ALT: 23 U/L (ref 0–44)
AST: 30 U/L (ref 15–41)
Albumin: 2.3 g/dL — ABNORMAL LOW (ref 3.5–5.0)
Alkaline Phosphatase: 52 U/L (ref 38–126)
Anion gap: 15 (ref 5–15)
BUN: 14 mg/dL (ref 6–20)
CO2: 19 mmol/L — ABNORMAL LOW (ref 22–32)
Calcium: 8.1 mg/dL — ABNORMAL LOW (ref 8.9–10.3)
Chloride: 105 mmol/L (ref 98–111)
Creatinine, Ser: 0.61 mg/dL (ref 0.44–1.00)
GFR, Estimated: >60 mL/min (ref >60)
Glucose, Bld: 217 mg/dL — ABNORMAL HIGH (ref 70–99)
Potassium: 3.6 mmol/L (ref 3.5–5.1)
Sodium: 139 mmol/L (ref 135–145)
Total Bilirubin: 1.1 mg/dL (ref 0.0–1.2)
Total Protein: 6.2 g/dL — ABNORMAL LOW (ref 6.5–8.1)

## 2024-03-18 LAB — CBC
HCT: 29.6 % — ABNORMAL LOW (ref 36.0–46.0)
Hemoglobin: 10.1 g/dL — ABNORMAL LOW (ref 12.0–15.0)
MCH: 29 pg (ref 26.0–34.0)
MCHC: 34.1 g/dL (ref 30.0–36.0)
MCV: 85.1 fL (ref 80.0–100.0)
Platelets: 239 K/uL (ref 150–400)
RBC: 3.48 MIL/uL — ABNORMAL LOW (ref 3.87–5.11)
RDW: 12 % (ref 11.5–15.5)
WBC: 8.6 K/uL (ref 4.0–10.5)
nRBC: 0 % (ref 0.0–0.2)

## 2024-03-18 LAB — BASIC METABOLIC PANEL WITH GFR
Anion gap: 11 (ref 5–15)
BUN: 16 mg/dL (ref 6–20)
CO2: 23 mmol/L (ref 22–32)
Calcium: 8 mg/dL — ABNORMAL LOW (ref 8.9–10.3)
Chloride: 104 mmol/L (ref 98–111)
Creatinine, Ser: 0.83 mg/dL (ref 0.44–1.00)
GFR, Estimated: >60 mL/min (ref >60)
Glucose, Bld: 225 mg/dL — ABNORMAL HIGH (ref 70–99)
Potassium: 3.8 mmol/L (ref 3.5–5.1)
Sodium: 138 mmol/L (ref 135–145)

## 2024-03-18 LAB — CBC WITH DIFFERENTIAL/PLATELET
Abs Immature Granulocytes: 0.02 K/uL (ref 0.00–0.07)
Basophils Absolute: 0 K/uL (ref 0.0–0.1)
Basophils Relative: 0 %
Eosinophils Absolute: 0 K/uL (ref 0.0–0.5)
Eosinophils Relative: 0 %
HCT: 32 % — ABNORMAL LOW (ref 36.0–46.0)
Hemoglobin: 10.8 g/dL — ABNORMAL LOW (ref 12.0–15.0)
Immature Granulocytes: 0 %
Lymphocytes Relative: 19 %
Lymphs Abs: 1.5 K/uL (ref 0.7–4.0)
MCH: 28.6 pg (ref 26.0–34.0)
MCHC: 33.8 g/dL (ref 30.0–36.0)
MCV: 84.9 fL (ref 80.0–100.0)
Monocytes Absolute: 0.4 K/uL (ref 0.1–1.0)
Monocytes Relative: 5 %
Neutro Abs: 5.8 K/uL (ref 1.7–7.7)
Neutrophils Relative %: 76 %
Platelets: 245 K/uL (ref 150–400)
RBC: 3.77 MIL/uL — ABNORMAL LOW (ref 3.87–5.11)
RDW: 11.9 % (ref 11.5–15.5)
WBC: 7.8 K/uL (ref 4.0–10.5)
nRBC: 0 % (ref 0.0–0.2)

## 2024-03-18 LAB — GLUCOSE, CAPILLARY
Glucose-Capillary: 219 mg/dL — ABNORMAL HIGH (ref 70–99)
Glucose-Capillary: 255 mg/dL — ABNORMAL HIGH (ref 70–99)
Glucose-Capillary: 205 mg/dL — ABNORMAL HIGH (ref 70–99)
Glucose-Capillary: 207 mg/dL — ABNORMAL HIGH (ref 70–99)
Glucose-Capillary: 184 mg/dL — ABNORMAL HIGH (ref 70–99)

## 2024-03-18 LAB — AMYLASE: Amylase: 43 U/L (ref 28–100)

## 2024-03-18 LAB — LIPASE, BLOOD: Lipase: 22 U/L (ref 11–51)

## 2024-03-18 MED ORDER — KETOROLAC TROMETHAMINE 15 MG/ML IJ SOLN
15.0000 mg | Freq: Four times a day (QID) | INTRAMUSCULAR | Status: DC | PRN
  Administered 2024-03-18 – 2024-03-19 (×3): 15 mg via INTRAVENOUS
  Filled 2024-03-18 (×3): qty 1

## 2024-03-18 MED ORDER — PROCHLORPERAZINE EDISYLATE 10 MG/2ML IJ SOLN
10.0000 mg | Freq: Four times a day (QID) | INTRAMUSCULAR | Status: DC | PRN
  Administered 2024-03-18: 10 mg via INTRAVENOUS
  Filled 2024-03-18 (×2): qty 2

## 2024-03-18 MED ORDER — IOHEXOL 300 MG/ML  SOLN
100.0000 mL | Freq: Once | INTRAMUSCULAR | Status: AC | PRN
  Administered 2024-03-18: 100 mL via INTRAVENOUS

## 2024-03-18 MED ORDER — PANTOPRAZOLE SODIUM 40 MG PO TBEC
40.0000 mg | DELAYED_RELEASE_TABLET | Freq: Two times a day (BID) | ORAL | Status: DC
  Administered 2024-03-19 – 2024-03-20 (×3): 40 mg via ORAL
  Filled 2024-03-18 (×4): qty 1

## 2024-03-18 MED ORDER — SODIUM CHLORIDE 0.9 % IV SOLN
12.5000 mg | Freq: Four times a day (QID) | INTRAVENOUS | Status: DC | PRN
  Administered 2024-03-18: 12.5 mg via INTRAVENOUS
  Filled 2024-03-18: qty 0.5

## 2024-03-18 MED ORDER — FAMOTIDINE 20 MG PO TABS: 40.0000 mg | ORAL_TABLET | Freq: Two times a day (BID) | ORAL | Status: DC

## 2024-03-18 NOTE — Progress Notes (Signed)
 Patient called nurse into the room with complaints of difficulty breathing. Therapist, sports went into patients room. Patient was assisted back into the bed and patients SPO2 was taken. SPO2 was 100% on Room air. Patient is stating that her pain is just below in the epigastric region. Provider has already been messaged about getting patient some more medication because Toradol  and Tylenol  are not helping her pain. Provider has not provider a response yet and no new orders at this moment. Patient currently ambulating independently with husband in the hallway to help with pain.

## 2024-03-18 NOTE — Plan of Care (Signed)

## 2024-03-18 NOTE — Plan of Care (Signed)
  Problem: Education: Goal: Ability to describe self-care measures that may prevent or decrease complications (Diabetes Survival Skills Education) will improve Outcome: Progressing Goal: Individualized Educational Video(s) Outcome: Progressing   Problem: Coping: Goal: Ability to adjust to condition or change in health will improve Outcome: Progressing   Problem: Fluid Volume: Goal: Ability to maintain a balanced intake and output will improve Outcome: Progressing   Problem: Health Behavior/Discharge Planning: Goal: Ability to identify and utilize available resources and services will improve Outcome: Progressing Goal: Ability to manage health-related needs will improve Outcome: Progressing   Problem: Metabolic: Goal: Ability to maintain appropriate glucose levels will improve Outcome: Progressing   Problem: Nutritional: Goal: Maintenance of adequate nutrition will improve Outcome: Progressing Goal: Progress toward achieving an optimal weight will improve Outcome: Progressing   Problem: Tissue Perfusion: Goal: Adequacy of tissue perfusion will improve Outcome: Progressing   Problem: Skin Integrity: Goal: Risk for impaired skin integrity will decrease Outcome: Progressing   Problem: Education: Goal: Knowledge of General Education information will improve Description: Including pain rating scale, medication(s)/side effects and non-pharmacologic comfort measures Outcome: Progressing   Problem: Health Behavior/Discharge Planning: Goal: Ability to manage health-related needs will improve Outcome: Progressing   Problem: Clinical Measurements: Goal: Ability to maintain clinical measurements within normal limits will improve Outcome: Progressing Goal: Will remain free from infection Outcome: Progressing Goal: Diagnostic test results will improve Outcome: Progressing Goal: Respiratory complications will improve Outcome: Progressing Goal: Cardiovascular complication will  be avoided Outcome: Progressing   Problem: Activity: Goal: Risk for activity intolerance will decrease Outcome: Progressing   Problem: Nutrition: Goal: Adequate nutrition will be maintained Outcome: Progressing   Problem: Coping: Goal: Level of anxiety will decrease Outcome: Progressing   Problem: Elimination: Goal: Will not experience complications related to bowel motility Outcome: Progressing Goal: Will not experience complications related to urinary retention Outcome: Progressing   Problem: Pain Managment: Goal: General experience of comfort will improve and/or be controlled Outcome: Progressing   Problem: Safety: Goal: Ability to remain free from injury will improve Outcome: Progressing   Problem: Skin Integrity: Goal: Risk for impaired skin integrity will decrease Outcome: Progressing

## 2024-03-18 NOTE — Progress Notes (Signed)
 1      PROGRESS NOTE    Carolyn Andrews  FMW:[id number] DOB: Jun 19, 1990 DOA: 03/17/2024 PCP: [facility]   Brief Narrative:   33 year old female with history of morbid obesity, hypertension, insulin -dependent diabetes mellitus type 2, hyperlipidemia, GERD, history of frequent intractable nausea and vomiting admission, who presents ED for chief concerns of nausea and vomiting.   10/26: Threw up once.  Also had some chest pressure.  GI consult   Assessment & Plan:   Principal Problem:   Intractable vomiting with nausea Active Problems:   Hyperglycemia due to diabetes mellitus (HCC)   Chronic hypertension   Elevated beta-hydroxybutyrate   Morbid obesity (HCC)   Hyperlipidemia   Abdominal pain   * Intractable vomiting with nausea Suspect secondary to Ozempic versus poorly controlled diabetes mellitus leading to gastroparesis Clear liquid diet for now Status post LR 1 L bolus Hydrated with normal saline Symptomatic support: Metoclopramide 5 mg IV every 6 hours as needed for nausea and vomiting, 3 days ordered; Phenergan  12.5 mg IV every 6 hours as needed for refractory nausea and vomiting, 1 day ordered UDS positive for opiate.  This could be the morphine  she received in the emergency room Urine pregnancy is negative Will consult GI considering her recurring episodes   Hyperlipidemia Rosuvastatin 10 mg nightly resumed   Morbid obesity (HCC) This complicates overall care and prognosis.    Chronic hypertension Home amlodipine 10 mg daily resumed Hydralazine 5 mg IV every 6 hours as needed for SBP > 170   Abdominal pain Secondary to nausea and vomiting and likely gastroparesis.  Increase Protonix  from 40 mg once daily to twice daily and stop Pepcid Add Phenergan  as needed for refractory nausea/vomiting Opioid medication will not be appropriate as this will slow down gastric motility and worsen pain Acetaminophen  650 mg p.o./rectal every 6 hours as  needed for mild pain, fever, headache;   DVT prophylaxis: Lovenox enoxaparin (LOVENOX) injection 40 mg Start: 03/17/24 2200 Place TED hose Start: 03/17/24 1157     Code Status: Full code Family Communication: Updated sister and parents at bedside Disposition Plan: specify when and where you expect patient to be discharged). Include barriers to DC in this tab.    Subjective:  Threw up once.  Had short lasting episode of chest pressure/pain which is resolved now.  Sister and parents are at bedside  Objective: Vitals:   03/17/24 2047 03/18/24 0612 03/18/24 0821 03/18/24 1253  BP: (!) 155/73 (!) 154/80 (!) 167/76 (!) 161/85  Pulse: 68 70 61 67  Resp: 18 18 16    Temp: 98.8 F (37.1 C) 98.5 F (36.9 C) 98 F (36.7 C)   TempSrc: Oral Oral Oral   SpO2: 100% 99% 100% 100%  Weight:      Height:        Intake/Output Summary (Last 24 hours) at 03/18/2024 1420 Last data filed at 03/18/2024 1300 Gross per 24 hour  Intake 2310.44 ml  Output --  Net 2310.44 ml   Filed Weights   03/17/24 0851  Weight: 86 kg    Examination:  General exam: Appears calm and comfortable  Respiratory system: Clear to auscultation. Respiratory effort normal. Cardiovascular system: S1 & S2 heard, RRR. No JVD, murmurs, rubs, gallops or clicks. No pedal edema. Gastrointestinal system: Abdomen is soft, benign Central nervous system: Alert and oriented. No focal neurological deficits. Extremities: Symmetric 5 x 5 power. Skin: No rashes, lesions or ulcers Psychiatry: Judgement and insight appear normal. Mood & affect  appropriate.     Data Reviewed: I have personally reviewed following labs and imaging studies  CBC: Recent Labs  Lab 03/14/24 1345 03/17/24 0922 03/18/24 0444  WBC 9.4 9.9 8.6  HGB 12.9 13.3 10.1*  HCT 37.7 39.6 29.6*  MCV 83.6 84.6 85.1  PLT 338 285 239   Basic Metabolic Panel: Recent Labs  Lab 03/15/24 0232 03/15/24 0725 03/15/24 1049 03/17/24 0922 03/18/24 0444  NA  137 137 139 133* 138  K 3.5 3.5 3.7 4.5 3.8  CL 99 101 99 98 104  CO2 26 28 28  20* 23  GLUCOSE 258* 199* 209* 435* 225*  BUN 20 19 19 17 16   CREATININE 0.87 0.81 0.90 0.75 0.83  CALCIUM 8.7* 8.5* 8.8* 9.6 8.0*   GFR: Estimated Creatinine Clearance: 92.1 mL/min (by C-G formula based on SCr of 0.83 mg/dL). Liver Function Tests: Recent Labs  Lab 03/14/24 1342 03/17/24 0922  AST 36 25  ALT 32 24  ALKPHOS 82 81  BILITOT 1.2 1.0  PROT 7.4 7.6  ALBUMIN 2.8* 2.9*   Recent Labs  Lab 03/14/24 1342 03/17/24 0922  LIPASE 29 46    CBG: Recent Labs  Lab 03/17/24 1736 03/17/24 2105 03/18/24 0634 03/18/24 0819 03/18/24 1146  GLUCAP 311* 229* 219* 255* 205*    Sepsis Labs: Recent Labs  Lab 03/17/24 1006  LATICACIDVEN 1.9    No results found for this or any previous visit (from the past 240 hours).       Radiology Studies: DG Chest Port 1 View Result Date: 03/17/2024 EXAM: 1 VIEW(S) XRAY OF THE CHEST 03/17/2024 09:56:00 AM COMPARISON: 03/14/2024 CLINICAL HISTORY: Chest pain. Pt to ED for continued vomiting and chest pain after being seen here on 10/22 for same. States she had felt better for one day and started feeling bad again yesterday, worse this morning. Has been vomiting all morning and also endorses SOB. Hx type 2 ; DM. Pt has not checked BG since hospital d/c. Pt is tachypneic. Recent early pregnancy loss. FINDINGS: LUNGS AND PLEURA: Low lung volumes. No focal pulmonary opacity. No pulmonary edema. No pleural effusion. No pneumothorax. HEART AND MEDIASTINUM: No acute abnormality of the cardiac and mediastinal silhouettes. BONES AND SOFT TISSUES: No acute osseous abnormality. IMPRESSION: 1. No acute cardiopulmonary process. 2. Low lung volumes. Electronically signed by: Waddell Calk MD 03/17/2024 10:23 AM EDT RP Workstation: [id number]        Scheduled Meds:  amLODipine  10 mg Oral Daily   enoxaparin (LOVENOX) injection  40 mg Subcutaneous Q24H   famotidine   40 mg Oral Daily   insulin  aspart  0-15 Units Subcutaneous TID WC   insulin  aspart  0-5 Units Subcutaneous QHS   insulin  glargine-yfgn  40 Units Subcutaneous Daily   pantoprazole   40 mg Oral Daily   rosuvastatin  10 mg Oral QHS   senna-docusate  2 tablet Oral Daily   Continuous Infusions:   LOS: 0 days    Time spent: 35 minutes    Cresencio Fairly, MD [facility] Pager [phone]  If 7PM-7AM, please contact night-coverage [email or web address]  03/18/2024, 2:20 PM

## 2024-03-19 ENCOUNTER — Inpatient Hospital Stay: Admitting: General Practice

## 2024-03-19 ENCOUNTER — Inpatient Hospital Stay

## 2024-03-19 ENCOUNTER — Encounter: Payer: Self-pay | Admitting: Internal Medicine

## 2024-03-19 ENCOUNTER — Encounter: Admission: EM | Disposition: A | Payer: Self-pay | Source: Home / Self Care | Attending: Internal Medicine

## 2024-03-19 DIAGNOSIS — K2960 Other gastritis without bleeding: Secondary | ICD-10-CM | POA: Diagnosis not present

## 2024-03-19 DIAGNOSIS — R1110 Vomiting, unspecified: Secondary | ICD-10-CM | POA: Diagnosis not present

## 2024-03-19 DIAGNOSIS — R112 Nausea with vomiting, unspecified: Secondary | ICD-10-CM | POA: Diagnosis not present

## 2024-03-19 DIAGNOSIS — R1013 Epigastric pain: Secondary | ICD-10-CM | POA: Diagnosis not present

## 2024-03-19 DIAGNOSIS — E1165 Type 2 diabetes mellitus with hyperglycemia: Secondary | ICD-10-CM | POA: Diagnosis not present

## 2024-03-19 DIAGNOSIS — Z794 Long term (current) use of insulin: Secondary | ICD-10-CM | POA: Diagnosis not present

## 2024-03-19 DIAGNOSIS — E109 Type 1 diabetes mellitus without complications: Secondary | ICD-10-CM | POA: Diagnosis not present

## 2024-03-19 DIAGNOSIS — K2970 Gastritis, unspecified, without bleeding: Secondary | ICD-10-CM | POA: Diagnosis not present

## 2024-03-19 DIAGNOSIS — R7889 Finding of other specified substances, not normally found in blood: Secondary | ICD-10-CM | POA: Diagnosis not present

## 2024-03-19 DIAGNOSIS — R109 Unspecified abdominal pain: Secondary | ICD-10-CM | POA: Diagnosis not present

## 2024-03-19 DIAGNOSIS — I10 Essential (primary) hypertension: Secondary | ICD-10-CM | POA: Diagnosis not present

## 2024-03-19 DIAGNOSIS — K3189 Other diseases of stomach and duodenum: Secondary | ICD-10-CM | POA: Diagnosis not present

## 2024-03-19 HISTORY — PX: ESOPHAGOGASTRODUODENOSCOPY: SHX5428

## 2024-03-19 LAB — GLUCOSE, CAPILLARY
Glucose-Capillary: 189 mg/dL — ABNORMAL HIGH (ref 70–99)
Glucose-Capillary: 209 mg/dL — ABNORMAL HIGH (ref 70–99)
Glucose-Capillary: 171 mg/dL — ABNORMAL HIGH (ref 70–99)
Glucose-Capillary: 117 mg/dL — ABNORMAL HIGH (ref 70–99)

## 2024-03-19 LAB — BASIC METABOLIC PANEL WITH GFR
Anion gap: 8 (ref 5–15)
BUN: 13 mg/dL (ref 6–20)
CO2: 21 mmol/L — ABNORMAL LOW (ref 22–32)
Calcium: 7.7 mg/dL — ABNORMAL LOW (ref 8.9–10.3)
Chloride: 106 mmol/L (ref 98–111)
Creatinine, Ser: 0.74 mg/dL (ref 0.44–1.00)
GFR, Estimated: >60 mL/min (ref >60)
Glucose, Bld: 203 mg/dL — ABNORMAL HIGH (ref 70–99)
Potassium: 3.6 mmol/L (ref 3.5–5.1)
Sodium: 135 mmol/L (ref 135–145)

## 2024-03-19 LAB — CBC
HCT: 31.5 % — ABNORMAL LOW (ref 36.0–46.0)
Hemoglobin: 10.7 g/dL — ABNORMAL LOW (ref 12.0–15.0)
MCH: 29 pg (ref 26.0–34.0)
MCHC: 34 g/dL (ref 30.0–36.0)
MCV: 85.4 fL (ref 80.0–100.0)
Platelets: 267 K/uL (ref 150–400)
RBC: 3.69 MIL/uL — ABNORMAL LOW (ref 3.87–5.11)
RDW: 12 % (ref 11.5–15.5)
WBC: 7.3 K/uL (ref 4.0–10.5)
nRBC: 0 % (ref 0.0–0.2)

## 2024-03-19 SURGERY — EGD (ESOPHAGOGASTRODUODENOSCOPY)
Anesthesia: General

## 2024-03-19 MED ORDER — METOCLOPRAMIDE HCL 5 MG PO TABS
10.0000 mg | ORAL_TABLET | Freq: Three times a day (TID) | ORAL | Status: DC
  Administered 2024-03-19 – 2024-03-20 (×4): 10 mg via ORAL
  Filled 2024-03-19 (×4): qty 2

## 2024-03-19 MED ORDER — LACTATED RINGERS IV SOLN: INTRAVENOUS | Status: DC | PRN

## 2024-03-19 MED ORDER — SUCCINYLCHOLINE CHLORIDE 200 MG/10ML IV SOSY
PREFILLED_SYRINGE | INTRAVENOUS | Status: DC | PRN
  Administered 2024-03-19: 100 mg via INTRAVENOUS

## 2024-03-19 MED ORDER — DEXMEDETOMIDINE HCL IN NACL 80 MCG/20ML IV SOLN
INTRAVENOUS | Status: DC | PRN
  Administered 2024-03-19: 12 ug via INTRAVENOUS

## 2024-03-19 MED ORDER — HYDROMORPHONE HCL 1 MG/ML IJ SOLN
0.5000 mg | INTRAMUSCULAR | Status: AC
  Administered 2024-03-19: 0.5 mg via INTRAVENOUS
  Filled 2024-03-19: qty 0.5

## 2024-03-19 MED ORDER — PROPOFOL 10 MG/ML IV BOLUS
INTRAVENOUS | Status: DC | PRN
  Administered 2024-03-19: 150 mg via INTRAVENOUS

## 2024-03-19 MED ORDER — INSULIN GLARGINE-YFGN 100 UNIT/ML ~~LOC~~ SOLN
40.0000 [IU] | Freq: Once | SUBCUTANEOUS | Status: AC
  Administered 2024-03-19: 40 [IU] via SUBCUTANEOUS
  Filled 2024-03-19: qty 0.4

## 2024-03-19 MED ORDER — INSULIN GLARGINE-YFGN 100 UNIT/ML ~~LOC~~ SOLN
43.0000 [IU] | Freq: Every day | SUBCUTANEOUS | Status: DC
  Administered 2024-03-20: 43 [IU] via SUBCUTANEOUS
  Filled 2024-03-19: qty 0.43

## 2024-03-19 MED ORDER — SODIUM CHLORIDE 0.9 % IV SOLN: INTRAVENOUS | Status: DC

## 2024-03-19 MED ORDER — LIDOCAINE HCL (CARDIAC) PF 100 MG/5ML IV SOSY
PREFILLED_SYRINGE | INTRAVENOUS | Status: DC | PRN
  Administered 2024-03-19: 100 mg via INTRAVENOUS

## 2024-03-19 MED ORDER — ONDANSETRON HCL 4 MG/2ML IJ SOLN
INTRAMUSCULAR | Status: DC | PRN
  Administered 2024-03-19: 4 mg via INTRAVENOUS

## 2024-03-19 NOTE — Anesthesia Preprocedure Evaluation (Signed)
 Anesthesia Evaluation  Patient identified by MRN, date of birth, ID band Patient awake    Reviewed: Allergy & Precautions, H&P , NPO status , Patient's Chart, lab work & pertinent test results, reviewed documented beta blocker date and time   Airway Mallampati: II   Neck ROM: full    Dental  (+) Poor Dentition   Pulmonary neg pulmonary ROS   Pulmonary exam normal        Cardiovascular Exercise Tolerance: Good hypertension, negative cardio ROS Normal cardiovascular exam Rhythm:regular Rate:Normal     Neuro/Psych negative neurological ROS  negative psych ROS   GI/Hepatic negative GI ROS, Neg liver ROS,,,  Endo/Other  negative endocrine ROSdiabetes, Poorly Controlled, Type 1, Insulin  Dependent    Renal/GU      Musculoskeletal   Abdominal   Peds  Hematology negative hematology ROS (+)   Anesthesia Other Findings Patient with PMH of GLP 1 last taken on 10/24 and intractable vomiting since 10/25. Last time the patient vomited was yesterday. Denies any symptoms of nausea today. Since the patient took her GLP1 within the last week and has been symptomatic, we will  use general anesthesia with a breathing tube for this procedure. Explained the risks and benefits to the patient. She states she understands and agrees to proceed.   Past Medical History: No date: Diabetes [facility]) History reviewed. No pertinent surgical history. BMI    Body Mass Index: 36.01 kg/m     Reproductive/Obstetrics negative OB ROS                              Anesthesia Physical Anesthesia Plan  ASA: 2  Anesthesia Plan: General ETT   Post-op Pain Management:    Induction: Intravenous and Rapid sequence  PONV Risk Score and Plan: 3 and Ondansetron  and Dexamethasone  Airway Management Planned: Oral ETT  Additional Equipment:   Intra-op Plan:   Post-operative Plan: Extubation in OR  Informed Consent: I have  reviewed the patients History and Physical, chart, labs and discussed the procedure including the risks, benefits and alternatives for the proposed anesthesia with the patient or authorized representative who has indicated his/her understanding and acceptance.     Dental Advisory Given  Plan Discussed with: Anesthesiologist, CRNA and Surgeon  Anesthesia Plan Comments: (Patient consented for risks of anesthesia including but not limited to:  - adverse reactions to medications - damage to eyes, teeth, lips or other oral mucosa - nerve damage due to positioning  - sore throat or hoarseness - Damage to heart, brain, nerves, lungs, other parts of body or loss of life  Patient voiced understanding and assent.)        Anesthesia Quick Evaluation

## 2024-03-19 NOTE — Transfer of Care (Signed)
 Immediate Anesthesia Transfer of Care Note  Patient: Carolyn Andrews [facility]  Procedure(s) Performed: EGD (ESOPHAGOGASTRODUODENOSCOPY)  Patient Location: PACU  Anesthesia Type:General  Level of Consciousness: awake, alert , and oriented  Airway & Oxygen Therapy: Patient Spontanous Breathing  Post-op Assessment: Report given to RN and Post -op Vital signs reviewed and stable  Post vital signs: stable  Last Vitals:  Vitals Value Taken Time  BP    Temp    Pulse 60 03/19/24 13:07  Resp 28 03/19/24 13:07  SpO2 95 % 03/19/24 13:07  Vitals shown include unfiled device data.  Last Pain:  Vitals:   03/19/24 1234  TempSrc: Temporal  PainSc:       Patients Stated Pain Goal: 2 (03/18/24 2100)  Complications: No notable events documented.

## 2024-03-19 NOTE — Anesthesia Postprocedure Evaluation (Signed)
 Anesthesia Post Note  Patient: Cherylene Ferrufino [facility]  Procedure(s) Performed: EGD (ESOPHAGOGASTRODUODENOSCOPY)  Patient location during evaluation: Endoscopy Anesthesia Type: General Level of consciousness: awake and alert Pain management: pain level controlled Vital Signs Assessment: post-procedure vital signs reviewed and stable Respiratory status: spontaneous breathing, nonlabored ventilation, respiratory function stable and patient connected to nasal cannula oxygen Cardiovascular status: blood pressure returned to baseline and stable Postop Assessment: no apparent nausea or vomiting Anesthetic complications: no   There were no known notable events for this encounter.   Last Vitals:  Vitals:   03/19/24 1327 03/19/24 1337  BP:  123/69  Pulse: 61 61  Resp:    Temp:    SpO2: 95% 95%    Last Pain:  Vitals:   03/19/24 1337  TempSrc:   PainSc: 0-No pain                 Debby Mines

## 2024-03-19 NOTE — Progress Notes (Signed)
 Patient still in unchanged 9 out of 10 abdominal pain one hour post IV Toradol  rocking back and forth in chair moaning with mother bedside. Dr. Maree notified. Pt states that she has not had anything to drink all night and she doesn't know why the pain is so bad when the IV pain meds usually help. Dr. Maree aware and said he was coming to check her out. Dr. Onita also made aware by Dr. Maree.

## 2024-03-19 NOTE — Inpatient Diabetes Management (Addendum)
 Inpatient Diabetes Program Recommendations  AACE/ADA: New Consensus Statement on Inpatient Glycemic Control   Target Ranges:  Prepandial:   less than 140 mg/dL      Peak postprandial:   less than 180 mg/dL (1-2 hours)      Critically ill patients:  140 - 180 mg/dL    Latest Reference Range & Units 03/18/24 08:19 03/18/24 11:46 03/18/24 17:10 03/18/24 22:11 03/19/24 09:07  Glucose-Capillary 70 - 99 mg/dL 744 (H) 794 (H) 792 (H) 184 (H) 189 (H)    Latest Reference Range & Units 03/17/24 09:22 03/18/24 04:44 03/18/24 17:16 03/19/24 03:53  CO2 22 - 32 mmol/L 20 (L) 23 19 (L) 21 (L)  Glucose 70 - 99 mg/dL 564 (H) 774 (H) 782 (H) 203 (H)  Anion gap 5 - 15  15 11 15 8     Latest Reference Range & Units 03/17/24 09:22  Beta-Hydroxybutyric Acid 0.05 - 0.27 mmol/L 2.07 (H)    Latest Reference Range & Units 10/01/11 11:09 03/15/24 10:49  Hemoglobin A1C 4.8 - 5.6 % 12.3 (H) >15.5 (H)   Review of Glycemic Control  Diabetes history: DM2 Outpatient Diabetes medications: Lantus 40 units QAM, Humalog 5 units TID with meals (not taking; never started as she was not aware it was prescribed), Metformin  XR 1500 mg at bedtime, Ozempic 0.25 mg Qweek (took on Friday 03/16/24) Current orders for Inpatient glycemic control: Semglee 40 units daily, Novolog  0-15 units TID with meals, Novolog  0-5 units QHS  Inpatient Diabetes Program Recommendations:    Insulin : Patient is currently NPO.  Please consider increasing Semglee to 43 units daily. Once diet is resumed and patient is eating well, please consider ordering Novolog  5 units TID with meals if patient eats at least 50% of meals.  Outpatient DM: At time of discharge, recommend patient stop Ozempic and Humalog meal coverage and correction insulin  be added.  If MD agreeable, please provide RX for Lantus Solostar and pens [phone]) and Humalog Kwikpens [phone]).   NOTE: In reviewing chart, noted patient was inpatient at [facility] 02/23/24-02/25/24 with DKA and  was discharged on Lantus 40 units daily, Metformin  1500 mg daily, Ozempic 0.25 mg Qweek, and Dexcom G7 CGM. Patient was admitted at [facility] 03/14/24-03/15/24 with DKA, N/V and inpatient diabetes coordinator met with patient on 03/15/24. At discharge on 03/15/24 patient was asked to stop Ozempic, take Lantus 40 units daily, Humalog 5 units TID with meals, and Metformin  XR 750 mg BID. Patient presented back to [facility] ED on 03/17/24 with intractable vomiting and nausea and lab glucose 435 mg/dl (CO2 20, AG 15, and beta-hydroxybutyric acid 2.07 - question if due to starvation ketosis versus mild DKA).  Patient is currently NPO and has GI consult ordered.  Will follow along.  Addendum 03/19/24@11 :35-Spoke with patient at bedside about diabetes and home regimen for diabetes control. Patient reports being followed by PCP for diabetes management and currently taking Lantus 40 units QAM, Metformin  XR 1500 mg at bedtime, and Ozempic 0.25 Qweek (Friday) as an outpatient for diabetes control. Patient reports taking DM medications as prescribed. Patient reports that she took Ozempic on Friday 03/16/24 and she woke up with N/V, abdominal pain, and feeling bad on Saturday and has felt poorly since then with N/V.  Patient states that she uses Dexcom G7 for glucose monitoring. She reports that she removed the Dexcom G7 sensor several days ago because she thought that may be what was causing her to feel bad and has not done CBGs since then. Patient reports that  prior to removing sensor, her glucose was consistently in the 200's mg/dl.  Inquired about Humalog insulin  (per discharge summary on 03/15/24) and patient states she was not aware that Humalog was prescribed and she did not know anything about it.  Discussed Lantus and Humalog insulin  and how they work and how they are typically taken.  Explained that she will likely need to take Humalog before meals as meal coverage and/or correction.  Patient reports that she has not taken any  medication at all since Saturday.  Discuss how stress response and sickness can cause hyperglycemia. Discussed that she will need to be checking glucose and taking insulin  (perhaps a lower dose) even during times she is not able to eat.  Discussed DKA and how DKA occurs. Explained that she needs to take insulin  consistently to prevent reoccurrence of DKA. Discussed Ozempic and how it works; explained that Ozempic is likely cause of nausea, vomiting, and abdominal pain. Explained that per d/c summary on 03/15/24, she was suppose to stop Ozempic.   Patient states that she was not aware she should still take insulin  when sick.  Patient reports she has plenty of Dexcom G7 sensors at home but she does not have much Lantus left.  At discharge, patient will need Rx for Lantus pens and Humalog pens (if patient is prescribed short acting insulin  at discharge).  Stressed to patient to get DM under better control to decrease risk of complications from uncontrolled DM.  Patient verbalized understanding of information discussed and reports no further questions at this time related to diabetes.  Thanks, Earnie Gainer, RN, MSN, CDCES Diabetes Coordinator Inpatient Diabetes Program [phone] (Team Pager from 8am to 5pm)

## 2024-03-19 NOTE — Op Note (Signed)
 [facility] Patient Name: Carolyn Andrews [facility] Procedure Date: 03/19/2024 12:09 PM MRN: [id number] Account #: [id number] Date of Birth: 1990-07-18 Admit Type: Inpatient Age: 33 Room: [facility] ENDO ROOM 2 Gender: Female Note Status: Finalized Instrument Name: Barnie GI Scope [id number] Procedure:             Upper GI endoscopy Indications:           Epigastric abdominal pain, Persistent vomiting Providers:             Corinn Jess Brooklyn MD, MD Referring MD:          [facility] Medicines:             General Anesthesia Complications:         No immediate complications. Estimated blood loss: None. Procedure:             Pre-Anesthesia Assessment:                        - Prior to the procedure, a History and Physical was                         performed, and patient medications and allergies were                         reviewed. The patient is competent. The risks and                         benefits of the procedure and the sedation options and                         risks were discussed with the patient. All questions                         were answered and informed consent was obtained.                         Patient identification and proposed procedure were                         verified by the physician, the nurse, the                         anesthesiologist, the anesthetist and the technician                         in the pre-procedure area in the procedure room in the                         endoscopy suite. Mental Status Examination: alert and                         oriented. Airway Examination: normal oropharyngeal                         airway and neck mobility. Respiratory Examination:                         clear to auscultation. CV Examination: normal.  Prophylactic Antibiotics: The patient does not require                         prophylactic antibiotics. Prior Anticoagulants: The                          patient has taken no anticoagulant or antiplatelet                         agents. ASA Grade Assessment: II - A patient with mild                         systemic disease. After reviewing the risks and                         benefits, the patient was deemed in satisfactory                         condition to undergo the procedure. The anesthesia                         plan was to use general anesthesia. Immediately prior                         to administration of medications, the patient was                         re-assessed for adequacy to receive sedatives. The                         heart rate, respiratory rate, oxygen saturations,                         blood pressure, adequacy of pulmonary ventilation, and                         response to care were monitored throughout the                         procedure. The physical status of the patient was                         re-assessed after the procedure.                        After obtaining informed consent, the endoscope was                         passed under direct vision. Throughout the procedure,                         the patient's blood pressure, pulse, and oxygen                         saturations were monitored continuously. The Endoscope                         was introduced through the mouth, and advanced to the  second part of duodenum. The upper GI endoscopy was                         accomplished without difficulty. The patient tolerated                         the procedure well. Findings:      The duodenal bulb, second portion of the duodenum and third portion of       the duodenum were normal. Biopsies were taken with a cold forceps for       histology.      Diffuse mildly erythematous mucosa without bleeding was found on the       greater curvature of the gastric body. Biopsies were taken with a cold       forceps for histology.      The incisura and gastric  antrum were normal. Biopsies were taken with a       cold forceps for Helicobacter pylori testing.      The cardia and gastric fundus were normal on retroflexion.      The gastroesophageal junction and examined esophagus were normal. Impression:            - Normal duodenal bulb, second portion of the duodenum                         and third portion of the duodenum. Biopsied.                        - Erythematous mucosa in the greater curvature of the                         gastric body. Biopsied.                        - Normal incisura and antrum. Biopsied.                        - Normal gastroesophageal junction and esophagus. Recommendation:        - Await pathology results.                        - Return patient to hospital ward for ongoing care.                        - Advance diet as tolerated today.                        - Continue present medications.                        - Hold ozempic and follow up with endocrine to discuss                         alternate therapies                        - Tight control of DM Procedure Code(s):     --- Professional ---                        [phone], Esophagogastroduodenoscopy, flexible,  transoral; with biopsy, single or multiple Diagnosis Code(s):     --- Professional ---                        K31.89, Other diseases of stomach and duodenum                        R10.13, Epigastric pain                        R11.15, Cyclical vomiting syndrome unrelated to                         migraine CPT copyright 2022 American Medical Association. All rights reserved. The codes documented in this report are preliminary and upon coder review may  be revised to meet current compliance requirements. Dr. Corinn Brooklyn Corinn Jess Brooklyn MD, MD 03/19/2024 1:05:59 PM This report has been signed electronically. Number of Addenda: 0 Note Initiated On: 03/19/2024 12:09 PM Estimated Blood Loss:  Estimated blood loss: none.       [facility]

## 2024-03-19 NOTE — Consult Note (Signed)
 Carolyn JONELLE Brooklyn, MD [address]  [place], [place] [address]  Main: [phone] Fax:  [phone] Pager: [phone]   Consultation  Referring Provider:     No ref. provider found Primary Care Physician:  [facility] Primary Gastroenterologist: [place]         Reason for Consultation: Intractable nausea and vomiting, epigastric pain  Date of Admission:  03/17/2024 Date of Consultation:  03/19/2024         HPI:   Carolyn Andrews is a 33 y.o. female metabolic syndrome, poorly controlled diabetes, A1c greater than 15, chronic constipation previously tried Linzess  and lactulose , postherpetic neuralgia on gabapentin, prior history of DKA, on metformin  as well as insulin  and recently started on Ozempic which has been uptitrated.  She was discharged on 10/23, presented to ER again on 10/25 secondary to intractable nausea vomiting as well as epigastric pain.  CT abdomen pelvis with contrast was unremarkable, normal serum lipase and amylase levels, mild normocytic anemia, blood sugar in 400s.  She has received several different antiemetics including Reglan 5 mg, Compazine,.  GI is consulted to evaluate for upper endoscopy.  She does not smoke or drink alcohol Denies marijuana use  Denies any family history of GI malignancy   NSAIDs: None  Antiplts/Anticoagulants/Anti thrombotics: None  GI Procedures: Colonoscopy in 04/2023, unremarkable  Past Medical History:  Diagnosis Date  . Constipation   . Diabetes (HCC)    type 2: metformin , Lantus, Ozempic  . Hypertension   . Proteinuria    has been going on for awhile    Past Surgical History:  Procedure Laterality Date  . COLONOSCOPY WITH PROPOFOL  N/A 05/04/2023   Procedure: COLONOSCOPY WITH PROPOFOL ;  Surgeon: Jinny Carmine, MD;  Location: [facility] ENDOSCOPY;  Service: Endoscopy;  Laterality: N/A;     Current Facility-Administered Medications:  .  0.9 %  sodium chloride  infusion, , Intravenous,  Continuous, Brendalee Matthies, Carolyn Skiff, MD, Last Rate: 40 mL/hr at 03/19/24 1241, New Bag at 03/19/24 1241 .  [MAR Hold] acetaminophen  (TYLENOL ) tablet 650 mg, 650 mg, Oral, Q6H PRN, 650 mg at 03/18/24 1337 **OR** [MAR Hold] acetaminophen  (TYLENOL ) suppository 650 mg, 650 mg, Rectal, Q6H PRN, Cox, Amy N, DO .  [MAR Hold] amLODipine (NORVASC) tablet 10 mg, 10 mg, Oral, Daily, Cox, Amy N, DO, 10 mg at 03/18/24 0859 .  [MAR Hold] enoxaparin (LOVENOX) injection 40 mg, 40 mg, Subcutaneous, Q24H, Cox, Amy N, DO .  [MAR Hold] hydrALAZINE (APRESOLINE) injection 5 mg, 5 mg, Intravenous, Q6H PRN, Cox, Amy N, DO, 5 mg at 03/17/24 1402 .  [MAR Hold] insulin  aspart (novoLOG ) injection 0-15 Units, 0-15 Units, Subcutaneous, TID WC, Cox, Amy N, DO, 3 Units at 03/19/24 1009 .  [MAR Hold] insulin  aspart (novoLOG ) injection 0-5 Units, 0-5 Units, Subcutaneous, QHS, Cox, Amy N, DO, 2 Units at 03/17/24 2129 .  [MAR Hold] insulin  glargine-yfgn (SEMGLEE) injection 43 Units, 43 Units, Subcutaneous, Daily, Maree Hue, MD .  ILDA Hold] ketorolac  (TORADOL ) 15 MG/ML injection 15 mg, 15 mg, Intravenous, Q6H PRN, Maree Hue, MD, 15 mg at 03/19/24 0815 .  [MAR Hold] melatonin tablet 5 mg, 5 mg, Oral, QHS PRN, Cox, Amy N, DO .  [MAR Hold] metoCLOPramide (REGLAN) injection 5 mg, 5 mg, Intravenous, Q6H PRN, Cox, Amy N, DO, 5 mg at 03/19/24 0911 .  [MAR Hold] pantoprazole  (PROTONIX ) EC tablet 40 mg, 40 mg, Oral, BID AC, Maree Hue, MD .  ILDA Hold] prochlorperazine (COMPAZINE) injection 10 mg, 10 mg, Intravenous, Q6H  PRN, Maree Hue, MD, 10 mg at 03/18/24 1649 .  [MAR Hold] rosuvastatin (CRESTOR) tablet 10 mg, 10 mg, Oral, QHS, Cox, Amy N, DO, 10 mg at 03/18/24 2216 .  [MAR Hold] senna-docusate (Senokot-S) tablet 2 tablet, 2 tablet, Oral, Daily, Cox, Amy N, DO, 2 tablet at 03/18/24 9141   Family History  Problem Relation Age of Onset  . Diabetes Mother      Social History   Tobacco Use  . Smoking status: Never  . Smokeless  tobacco: Never  Vaping Use  . Vaping status: Never Used  Substance Use Topics  . Alcohol use: No  . Drug use: Never    Allergies as of 03/17/2024 - Review Complete 03/17/2024  Allergen Reaction Noted  . Lisinopril Swelling 04/06/2023    Review of Systems:    All systems reviewed and negative except where noted in HPI.   Physical Exam:  Vital signs in last 24 hours: Temp:  [97.3 F (36.3 C)-98 F (36.7 C)] 97.3 F (36.3 C) (10/27 1234) Pulse Rate:  [67-75] 67 (10/27 1234) Resp:  [17-18] 18 (10/27 1234) BP: (154-179)/(62-85) 179/82 (10/27 1234) SpO2:  [98 %-100 %] 100 % (10/27 1234) Last BM Date : 03/17/24 General:   Alert,  Well-developed, well-nourished, pleasant and cooperative in NAD Eyes:  Sclera clear, no icterus.   Conjunctiva pink. Lungs:  Respirations even and unlabored.  Clear throughout to auscultation.   No wheezes, crackles, or rhonchi. No acute distress. Heart:  Regular rate and rhythm; no murmurs, clicks, rubs, or gallops. Abdomen:  Normal bowel sounds. Soft, non-tender and non-distended without masses, hepatosplenomegaly or hernias noted.  No guarding or rebound tenderness.   Rectal: Not performed Extremities:  No clubbing or edema.  No cyanosis. Neurologic:  Alert and oriented x3 Skin:  Intact without significant lesions or rashes. No jaundice. Psych:  Alert and cooperative. Normal mood and affect.  LAB RESULTS:    Latest Ref Rng & Units 03/19/2024    3:53 AM 03/18/2024    5:16 PM 03/18/2024    4:44 AM  CBC  WBC 4.0 - 10.5 K/uL 7.3  7.8  8.6   Hemoglobin 12.0 - 15.0 g/dL 89.2  89.1  89.8   Hematocrit 36.0 - 46.0 % 31.5  32.0  29.6   Platelets 150 - 400 K/uL 267  245  239     BMET    Latest Ref Rng & Units 03/19/2024    3:53 AM 03/18/2024    5:16 PM 03/18/2024    4:44 AM  BMP  Glucose 70 - 99 mg/dL 796  782  774   BUN 6 - 20 mg/dL 13  14  16    Creatinine 0.44 - 1.00 mg/dL 9.25  9.38  9.16   Sodium 135 - 145 mmol/L 135  139  138   Potassium  3.5 - 5.1 mmol/L 3.6  3.6  3.8   Chloride 98 - 111 mmol/L 106  105  104   CO2 22 - 32 mmol/L 21  19  23    Calcium 8.9 - 10.3 mg/dL 7.7  8.1  8.0     LFT    Latest Ref Rng & Units 03/18/2024    5:16 PM 03/17/2024    9:22 AM 03/14/2024    1:42 PM  Hepatic Function  Total Protein 6.5 - 8.1 g/dL 6.2  7.6  7.4   Albumin 3.5 - 5.0 g/dL 2.3  2.9  2.8   AST 15 - 41 U/L 30  25  36   ALT 0 - 44 U/L 23  24  32   Alk Phosphatase 38 - 126 U/L 52  81  82   Total Bilirubin 0.0 - 1.2 mg/dL 1.1  1.0  1.2   Bilirubin, Direct 0.0 - 0.2 mg/dL   <9.8      STUDIES: CT ABDOMEN PELVIS W CONTRAST Result Date: 03/18/2024 CLINICAL DATA:  Abdominal pain, acute, nonlocalized. EXAM: CT ABDOMEN AND PELVIS WITH CONTRAST TECHNIQUE: Multidetector CT imaging of the abdomen and pelvis was performed using the standard protocol following bolus administration of intravenous contrast. RADIATION DOSE REDUCTION: This exam was performed according to the departmental dose-optimization program which includes automated exposure control, adjustment of the mA and/or kV according to patient size and/or use of iterative reconstruction technique. CONTRAST:  OMNIPAQUE  IOHEXOL  300 MG/ML  SOLN COMPARISON:  12/26/2023. FINDINGS: Lower chest: No acute abnormality. Hepatobiliary: No focal liver abnormality is seen. No gallstones, gallbladder wall thickening, or biliary dilatation. Pancreas: Unremarkable. No pancreatic ductal dilatation or surrounding inflammatory changes. Spleen: Normal in size without focal abnormality. Adrenals/Urinary Tract: The adrenal glands are within normal limits. The kidneys enhance symmetrically. No renal calculus or obstructive uropathy bilaterally. The bladder is within normal limits for degree of distension. Stomach/Bowel: The stomach is within normal limits. No bowel obstruction, free air, or pneumatosis is seen. Appendix appears normal. Vascular/Lymphatic: No significant vascular findings are present. No  enlarged abdominal or pelvic lymph nodes. Reproductive: Uterus and bilateral adnexa are unremarkable. Other: No abdominopelvic ascites. Musculoskeletal: Degenerative changes are present in the thoracolumbar spine. No acute osseous abnormality. IMPRESSION: No acute intra-abdominal process. Electronically Signed   By: Leita Birmingham M.D.   On: 03/18/2024 18:09      Impression / Plan:   Carolyn Andrews is a 33 y.o. female with metabolic syndrome, poorly controlled diabetes, history of DKA, on metformin , insulin  as well as Ozempic presented with epigastric pain, intractable nausea and vomiting  Epigastric pain with intractable nausea and vomiting No evidence of DKA or acute pancreatitis Differentials include ulcer or nonulcer dyspepsia or gastroparesis or side effects from Ozempic or combination of these Continue acid suppressive therapy Continue antiemetics, okay to increase Reglan to 10 mg before each meal and at bedtime for 2 weeks Recommend upper endoscopy for further evaluation Unremarkable, it is reasonable to hold Ozempic and discuss about alternative treatment for diabetes temporarily or decrease Ozempic dose Needs close follow-up with endocrinologist Also, consider 2 weeks course of erythromycin if symptoms are persistent Follow-up with GI as outpatient Evaluate gastroparesis with gastric emptying study as outpatient  I have discussed alternative options, risks & benefits,  which include, but are not limited to, bleeding, infection, perforation,respiratory complication & drug reaction.  The patient agrees with this plan & written consent will be obtained.     Thank you for involving me in the care of this patient.      LOS: 1 day   Carolyn Brooklyn, MD  03/19/2024, 12:52 PM    Note: This dictation was prepared with Dragon dictation along with smaller phrase technology. Any transcriptional errors that result from this process are unintentional.

## 2024-03-19 NOTE — Progress Notes (Signed)
 1      PROGRESS NOTE    Carolyn Andrews  FMW:[id number] DOB: March 05, 1991 DOA: 03/17/2024 PCP: [facility]   Brief Narrative:   33 year old female with history of morbid obesity, hypertension, insulin -dependent diabetes mellitus type 2, hyperlipidemia, GERD, history of frequent intractable nausea and vomiting admission, who presents ED for chief concerns of nausea and vomiting.   10/26: Threw up once.  Also had some chest pressure.  GI consult 10/27: EGD normal   Assessment & Plan:   Principal Problem:   Intractable vomiting with nausea Active Problems:   Hyperglycemia due to diabetes mellitus (HCC)   Chronic hypertension   Elevated beta-hydroxybutyrate   Morbid obesity (HCC)   Hyperlipidemia   Abdominal pain   Intractable nausea and vomiting   Epigastric pain, Intractable vomiting with nausea Suspect secondary to Ozempic versus poorly controlled diabetes mellitus leading to gastroparesis Clear liquid diet for now UDS positive for opiate.  This could be the morphine  she received in the emergency room Urine pregnancy is negative CT Abd-pelvis neg for acute patho/pancreatitis Differentials include ulcer or nonulcer dyspepsia or gastroparesis or side effects from Ozempic or combination of these Continue acid suppressive therapy Continue antiemetics, okay to increase Reglan to 10 mg before each meal and at bedtime for 2 weeks EGD normal I've recommended to hold Ozempic Also, consider 2 weeks course of erythromycin if symptoms are persistent Follow-up with GI as outpatient Evaluate gastroparesis with gastric emptying study as outpatient Gave 0.5 mg Dilaudid  once for excruciating pain  Hyperlipidemia Rosuvastatin 10 mg nightly resumed   Morbid obesity (HCC) This complicates overall care and prognosis.    Chronic hypertension Home amlodipine 10 mg daily resumed Hydralazine 5 mg IV every 6 hours as needed for SBP > 170  DM SSI, Increase Semglee  43 units    DVT prophylaxis: Lovenox enoxaparin (LOVENOX) injection 40 mg Start: 03/17/24 2200 Place TED hose Start: 03/17/24 1157     Code Status: Full code Family Communication: Updated sister, boyfriend and parents at bedside Disposition Plan: possible D/C in 1-2 days depending on clinical condition, diet tolerance    Subjective:  C/o epigastric pain. Nausea +  Objective: Vitals:   03/19/24 1317 03/19/24 1327 03/19/24 1337 03/19/24 1516  BP: 102/61  123/69 128/68  Pulse:  61 61 (!) 58  Resp:    17  Temp:      TempSrc:      SpO2:  95% 95% 96%  Weight:      Height:       No intake or output data in the 24 hours ending 03/19/24 1626  Filed Weights   03/17/24 0851  Weight: 86 kg    Examination:  General exam: Appears calm and comfortable  Respiratory system: Clear to auscultation. Respiratory effort normal. Cardiovascular system: S1 & S2 heard, RRR. No JVD, murmurs, rubs, gallops or clicks. No pedal edema. Gastrointestinal system: Abdomen is soft, benign Central nervous system: Alert and oriented. No focal neurological deficits. Extremities: Symmetric 5 x 5 power. Skin: No rashes, lesions or ulcers Psychiatry: Judgement and insight appear normal. Mood & affect appropriate.     Data Reviewed: I have personally reviewed following labs and imaging studies  CBC: Recent Labs  Lab 03/14/24 1345 03/17/24 0922 03/18/24 0444 03/18/24 1716 03/19/24 0353  WBC 9.4 9.9 8.6 7.8 7.3  NEUTROABS  --   --   --  5.8  --   HGB 12.9 13.3 10.1* 10.8* 10.7*  HCT 37.7 39.6 29.6* 32.0*  31.5*  MCV 83.6 84.6 85.1 84.9 85.4  PLT 338 285 239 245 267   Basic Metabolic Panel: Recent Labs  Lab 03/15/24 1049 03/17/24 0922 03/18/24 0444 03/18/24 1716 03/19/24 0353  NA 139 133* 138 139 135  K 3.7 4.5 3.8 3.6 3.6  CL 99 98 104 105 106  CO2 28 20* 23 19* 21*  GLUCOSE 209* 435* 225* 217* 203*  BUN 19 17 16 14 13   CREATININE 0.90 0.75 0.83 0.61 0.74  CALCIUM 8.8* 9.6 8.0*  8.1* 7.7*   GFR: Estimated Creatinine Clearance: 95.6 mL/min (by C-G formula based on SCr of 0.74 mg/dL). Liver Function Tests: Recent Labs  Lab 03/14/24 1342 03/17/24 0922 03/18/24 1716  AST 36 25 30  ALT 32 24 23  ALKPHOS 82 81 52  BILITOT 1.2 1.0 1.1  PROT 7.4 7.6 6.2*  ALBUMIN 2.8* 2.9* 2.3*   Recent Labs  Lab 03/14/24 1342 03/17/24 0922 03/18/24 1716  LIPASE 29 46 22  AMYLASE  --   --  43    CBG: Recent Labs  Lab 03/18/24 1710 03/18/24 2211 03/19/24 0907 03/19/24 1124 03/19/24 1619  GLUCAP 207* 184* 189* 209* 171*    Sepsis Labs: Recent Labs  Lab 03/17/24 1006  LATICACIDVEN 1.9    No results found for this or any previous visit (from the past 240 hours).       Radiology Studies: DG Abd 1 View Result Date: 03/19/2024 CLINICAL DATA:  Pain, vomiting. EXAM: ABDOMEN - 1 VIEW COMPARISON:  CT yesterday FINDINGS: Normal bowel gas pattern. No evidence of obstruction. Excreted IV contrast in the renal collecting systems and urinary bladder from yesterday's CT. No radiopaque calculi. IMPRESSION: Normal bowel gas pattern.  No radiographic explanation for pain. Electronically Signed   By: Andrea Gasman M.D.   On: 03/19/2024 15:29   CT ABDOMEN PELVIS W CONTRAST Result Date: 03/18/2024 CLINICAL DATA:  Abdominal pain, acute, nonlocalized. EXAM: CT ABDOMEN AND PELVIS WITH CONTRAST TECHNIQUE: Multidetector CT imaging of the abdomen and pelvis was performed using the standard protocol following bolus administration of intravenous contrast. RADIATION DOSE REDUCTION: This exam was performed according to the departmental dose-optimization program which includes automated exposure control, adjustment of the mA and/or kV according to patient size and/or use of iterative reconstruction technique. CONTRAST:  OMNIPAQUE  IOHEXOL  300 MG/ML  SOLN COMPARISON:  12/26/2023. FINDINGS: Lower chest: No acute abnormality. Hepatobiliary: No focal liver abnormality is seen. No  gallstones, gallbladder wall thickening, or biliary dilatation. Pancreas: Unremarkable. No pancreatic ductal dilatation or surrounding inflammatory changes. Spleen: Normal in size without focal abnormality. Adrenals/Urinary Tract: The adrenal glands are within normal limits. The kidneys enhance symmetrically. No renal calculus or obstructive uropathy bilaterally. The bladder is within normal limits for degree of distension. Stomach/Bowel: The stomach is within normal limits. No bowel obstruction, free air, or pneumatosis is seen. Appendix appears normal. Vascular/Lymphatic: No significant vascular findings are present. No enlarged abdominal or pelvic lymph nodes. Reproductive: Uterus and bilateral adnexa are unremarkable. Other: No abdominopelvic ascites. Musculoskeletal: Degenerative changes are present in the thoracolumbar spine. No acute osseous abnormality. IMPRESSION: No acute intra-abdominal process. Electronically Signed   By: Leita Birmingham M.D.   On: 03/18/2024 18:09        Scheduled Meds:  amLODipine  10 mg Oral Daily   enoxaparin (LOVENOX) injection  40 mg Subcutaneous Q24H   insulin  aspart  0-15 Units Subcutaneous TID WC   insulin  aspart  0-5 Units Subcutaneous QHS   [START ON 03/20/2024] insulin   glargine-yfgn  43 Units Subcutaneous Daily   metoCLOPramide  10 mg Oral TID AC & HS   pantoprazole   40 mg Oral BID AC   rosuvastatin  10 mg Oral QHS   senna-docusate  2 tablet Oral Daily   Continuous Infusions:   LOS: 1 day    Time spent: 35 minutes    Cresencio Fairly, MD [facility] Pager [phone]  If 7PM-7AM, please contact night-coverage [email or web address]  03/19/2024, 4:26 PM

## 2024-03-19 NOTE — Anesthesia Procedure Notes (Signed)
 Procedure Name: Intubation Date/Time: 03/19/2024 12:54 PM  Performed by: Norleen Alberta HERO., CRNAPre-anesthesia Checklist: Patient identified, Patient being monitored, Timeout performed, Emergency Drugs available and Suction available Patient Re-evaluated:Patient Re-evaluated prior to induction Oxygen Delivery Method: Circle system utilized Preoxygenation: Pre-oxygenation with 100% oxygen Induction Type: IV induction and Rapid sequence Laryngoscope Size: 3 and McGrath Grade View: Grade I Tube type: Oral Tube size: 6.5 mm Number of attempts: 1 Airway Equipment and Method: Stylet Placement Confirmation: ETT inserted through vocal cords under direct vision, positive ETCO2 and breath sounds checked- equal and bilateral Secured at: 20 cm Tube secured with: Tape Dental Injury: Teeth and Oropharynx as per pre-operative assessment

## 2024-03-19 NOTE — Plan of Care (Signed)

## 2024-03-19 NOTE — Progress Notes (Signed)
 Pt without any pain all afternoon. Drinking water with no issues. Trying juices for dinner.

## 2024-03-20 ENCOUNTER — Other Ambulatory Visit: Payer: Self-pay

## 2024-03-20 DIAGNOSIS — E1165 Type 2 diabetes mellitus with hyperglycemia: Secondary | ICD-10-CM | POA: Diagnosis not present

## 2024-03-20 DIAGNOSIS — R112 Nausea with vomiting, unspecified: Secondary | ICD-10-CM | POA: Diagnosis not present

## 2024-03-20 LAB — GLUCOSE, CAPILLARY
Glucose-Capillary: 115 mg/dL — ABNORMAL HIGH (ref 70–99)
Glucose-Capillary: 130 mg/dL — ABNORMAL HIGH (ref 70–99)

## 2024-03-20 LAB — SURGICAL PATHOLOGY

## 2024-03-20 MED ORDER — METOCLOPRAMIDE HCL 10 MG PO TABS
10.0000 mg | ORAL_TABLET | Freq: Four times a day (QID) | ORAL | 0 refills | Status: DC | PRN
  Filled 2024-03-20: qty 30, 8d supply, fill #0

## 2024-03-20 MED ORDER — INSULIN GLARGINE 100 UNIT/ML SOLOSTAR PEN
43.0000 [IU] | PEN_INJECTOR | Freq: Every day | SUBCUTANEOUS | 0 refills | Status: AC
  Filled 2024-03-20: qty 15, 34d supply, fill #0

## 2024-03-20 MED ORDER — INSULIN LISPRO (1 UNIT DIAL) 100 UNIT/ML (KWIKPEN)
5.0000 [IU] | PEN_INJECTOR | Freq: Three times a day (TID) | SUBCUTANEOUS | 0 refills | Status: AC
  Filled 2024-03-20: qty 6, 40d supply, fill #0

## 2024-03-20 NOTE — Plan of Care (Signed)

## 2024-03-20 NOTE — Plan of Care (Signed)
 IV removed, discharge instructions reviewed, patients medication delivered to room and patient discharged to home

## 2024-03-21 ENCOUNTER — Ambulatory Visit: Payer: Self-pay | Admitting: Gastroenterology

## 2024-03-22 NOTE — Discharge Summary (Signed)
 Physician Discharge Summary   Patient: Carolyn Andrews MRN: [id number] DOB: 12-Dec-1990  Admit date:     03/17/2024  Discharge date: 03/20/2024  Discharge Physician: Cresencio Fairly   PCP: [facility]   Recommendations at discharge:    F/up with outpt providers as requested  Discharge Diagnoses: Principal Problem:   Intractable vomiting with nausea Active Problems:   Hyperglycemia due to diabetes mellitus (HCC)   Chronic hypertension   Elevated beta-hydroxybutyrate   Morbid obesity (HCC)   Hyperlipidemia   Abdominal pain   Intractable nausea and vomiting  Resolved Problems:   Intractable nausea and vomiting   Intractable vomiting  Hospital Course: [name] is a 33 year old female with history of morbid obesity, hypertension, insulin -dependent diabetes mellitus type 2, hyperlipidemia, GERD, history of frequent intractable nausea and vomiting admission, who presents ED for chief concerns of nausea and vomiting.  Vitals in the ED showed t of 98, rr was initially 24 it improved to 17, hr 67, blood pressure initially 212/104 it improved to 170/131, SpO2 100% on room air.  Serum sodium is 133, potassium 4.5, chloride 98, bicarb 20, BUN 17, serum creatinine 0.75, eGFR greater than 60, nonfasting glucose 435, WBC 9.9, hemoglobin 13.3, platelets of 284.  Anion gap was 15.  Beta hydroxybutyrate elevated 2.07.  High sensitive troponin was 7.  Lactic acid 1.9.  A1c on 03/15/2024, was greater than 15.5.  ED treatment: Morphine  4 mg IV one-time dose, ondansetron  4 mg IV one-time dose, famotidine 20 mg IV one-time dose, LR 1 L bolus, metoclopramide 10 mg IV one-time dose.  Assessment and Plan:  33 year old female with history of morbid obesity, hypertension, insulin -dependent diabetes mellitus type 2, hyperlipidemia, GERD, history of frequent intractable nausea and vomiting admission, who presents ED for chief concerns of nausea and vomiting.     10/26: Threw up once.  Also had some chest pressure.  GI consult 10/27: EGD normal   Epigastric pain, Intractable vomiting with nausea UDS positive for opiate.  This could be the morphine  she received in the emergency room Urine pregnancy is negative CT Abd-pelvis neg for acute patho/pancreatitis Differentials include ulcer or nonulcer dyspepsia or gastroparesis or side effects from Ozempic or combination of these Continue acid suppressive therapy EGD normal I've recommended to hold Ozempic at DC Follow-up with GI as outpatient No further symptoms and tolerated diet   Hyperlipidemia Rosuvastatin 10 mg nightly resumed   Morbid obesity (HCC) This complicates overall care and prognosis.    Chronic hypertension DM  Prescription provided for insulin  pens        Consultants: GI Procedures performed: egd  Disposition: Home Diet recommendation:  Discharge Diet Orders (From admission, onward)     Start     Ordered   03/20/24 0000  Diet - low sodium heart healthy        03/20/24 1154           Carb modified diet DISCHARGE MEDICATION: Allergies as of 03/20/2024       Reactions   Lisinopril Swelling        Medication List     STOP taking these medications    insulin  glargine 100 UNIT/ML injection Commonly known as: LANTUS Replaced by: Lantus SoloStar 100 UNIT/ML Solostar Pen   OZEMPIC (2 MG/DOSE) [place]       TAKE these medications    amLODipine 10 MG tablet Commonly known as: NORVASC Take 10 mg by mouth daily.   chlorthalidone 25 MG tablet Commonly known as: HYGROTON  Take 25 mg by mouth daily.   dicyclomine  20 MG tablet Commonly known as: BENTYL  Take 1 tablet (20 mg total) by mouth every 8 (eight) hours as needed.   famotidine 40 MG tablet Commonly known as: PEPCID Take 40 mg by mouth daily.   furosemide 20 MG tablet Commonly known as: LASIX Take 40 mg by mouth daily.   insulin  lispro 100 UNIT/ML KwikPen Commonly known as: HumaLOG  KwikPen Inject 5 Units into the skin 3 (three) times daily.   lactulose  10 GM/15ML solution Commonly known as: CHRONULAC  SMARTSIG:Milliliter(s) By Mouth   Lantus SoloStar 100 UNIT/ML Solostar Pen Generic drug: insulin  glargine Inject 43 Units into the skin daily. Replaces: insulin  glargine 100 UNIT/ML injection   linaclotide  290 MCG Caps capsule Commonly known as: Linzess  Take 1 capsule (290 mcg total) by mouth daily before breakfast.   metFORMIN  750 MG 24 hr tablet Commonly known as: GLUCOPHAGE -XR Take 750 mg by mouth 2 (two) times daily.   metoCLOPramide 10 MG tablet Commonly known as: REGLAN Take 1 tablet (10 mg total) by mouth every 6 (six) hours as needed for up to 10 days for nausea, vomiting or refractory nausea / vomiting.   ondansetron  4 MG disintegrating tablet Commonly known as: ZOFRAN -ODT Take 1 tablet (4 mg total) by mouth every 6 (six) hours as needed for nausea or vomiting.   pantoprazole  40 MG tablet Commonly known as: Protonix  Take 1 tablet (40 mg total) by mouth daily.   rosuvastatin 10 MG tablet Commonly known as: CRESTOR Take 10 mg by mouth at bedtime.   senna-docusate 8.6-50 MG tablet Commonly known as: Senokot-S Take 2 tablets by mouth daily.   Vitamin D (Ergocalciferol) 1.25 MG (50000 UNIT) Caps capsule Commonly known as: DRISDOL Take 50,000 Units by mouth once a week.        Follow-up [employer] [employer] Schedule an appointment as soon as possible for a visit in 1 week(s).   Why: [facility] Discharge F/UP Contact information: [address] [place] [address] [phone]         Carolyn Carolyn Skiff, MD. Schedule an appointment as soon as possible for a visit in 2 week(s).   Specialty: Gastroenterology Why: [facility] Discharge F/UP Contact information: [address] [place] [address] [phone]                Discharge Exam: Carolyn Andrews   03/17/24 0851  Weight: 86 kg    General exam: Appears calm and comfortable  Respiratory system: Clear to auscultation. Respiratory effort normal. Cardiovascular system: S1 & S2 heard, RRR. No JVD, murmurs, rubs, gallops or clicks. No pedal edema. Gastrointestinal system: Abdomen is soft, benign Central nervous system: Alert and oriented. No focal neurological deficits. Extremities: Symmetric 5 x 5 power. Skin: No rashes, lesions or ulcers Psychiatry: Judgement and insight appear normal. Mood & affect appropriate.   Condition at discharge: good  The results of significant diagnostics from this hospitalization (including imaging, microbiology, ancillary and laboratory) are listed below for reference.   Imaging Studies: DG Abd 1 View Result Date: 03/19/2024 CLINICAL DATA:  Pain, vomiting. EXAM: ABDOMEN - 1 VIEW COMPARISON:  CT yesterday FINDINGS: Normal bowel gas pattern. No evidence of obstruction. Excreted IV contrast in the renal collecting systems and urinary bladder from yesterday's CT. No radiopaque calculi. IMPRESSION: Normal bowel gas pattern.  No radiographic explanation for pain. Electronically Signed   By: Andrea Gasman M.D.   On: 03/19/2024 15:29  CT ABDOMEN PELVIS W CONTRAST Result Date: 03/18/2024 CLINICAL DATA:  Abdominal pain, acute, nonlocalized. EXAM: CT ABDOMEN AND PELVIS WITH CONTRAST TECHNIQUE: Multidetector CT imaging of the abdomen and pelvis was performed using the standard protocol following bolus administration of intravenous contrast. RADIATION DOSE REDUCTION: This exam was performed according to the departmental dose-optimization program which includes automated exposure control, adjustment of the mA and/or kV according to patient size and/or use of iterative reconstruction technique. CONTRAST:  OMNIPAQUE  IOHEXOL  300 MG/ML  SOLN COMPARISON:  12/26/2023. FINDINGS: Lower chest: No acute abnormality. Hepatobiliary: No focal liver abnormality is seen. No gallstones, gallbladder wall  thickening, or biliary dilatation. Pancreas: Unremarkable. No pancreatic ductal dilatation or surrounding inflammatory changes. Spleen: Normal in size without focal abnormality. Adrenals/Urinary Tract: The adrenal glands are within normal limits. The kidneys enhance symmetrically. No renal calculus or obstructive uropathy bilaterally. The bladder is within normal limits for degree of distension. Stomach/Bowel: The stomach is within normal limits. No bowel obstruction, free air, or pneumatosis is seen. Appendix appears normal. Vascular/Lymphatic: No significant vascular findings are present. No enlarged abdominal or pelvic lymph nodes. Reproductive: Uterus and bilateral adnexa are unremarkable. Other: No abdominopelvic ascites. Musculoskeletal: Degenerative changes are present in the thoracolumbar spine. No acute osseous abnormality. IMPRESSION: No acute intra-abdominal process. Electronically Signed   By: Leita Birmingham M.D.   On: 03/18/2024 18:09   DG Chest Port 1 View Result Date: 03/17/2024 EXAM: 1 VIEW(S) XRAY OF THE CHEST 03/17/2024 09:56:00 AM COMPARISON: 03/14/2024 CLINICAL HISTORY: Chest pain. Pt to ED for continued vomiting and chest pain after being seen here on 10/22 for same. States she had felt better for one day and started feeling bad again yesterday, worse this morning. Has been vomiting all morning and also endorses SOB. Hx type 2 ; DM. Pt has not checked BG since hospital d/c. Pt is tachypneic. Recent early pregnancy loss. FINDINGS: LUNGS AND PLEURA: Low lung volumes. No focal pulmonary opacity. No pulmonary edema. No pleural effusion. No pneumothorax. HEART AND MEDIASTINUM: No acute abnormality of the cardiac and mediastinal silhouettes. BONES AND SOFT TISSUES: No acute osseous abnormality. IMPRESSION: 1. No acute cardiopulmonary process. 2. Low lung volumes. Electronically signed by: Birmingham Calk MD 03/17/2024 10:23 AM EDT RP Workstation: [id number]   DG Chest 2 View Result Date:  03/14/2024 EXAM: 2 VIEW(S) XRAY OF THE CHEST 03/14/2024 01:58:52 PM COMPARISON: 09/27/2023 CLINICAL HISTORY: CP. Pt coming to hospital with really bad chest pains. Pt denies being pregnant and states she may have had a miscarriage. FINDINGS: LUNGS AND PLEURA: Underinflation. No focal pulmonary opacity. No pulmonary edema. No pleural effusion. No pneumothorax. HEART AND MEDIASTINUM: No acute abnormality of the cardiac and mediastinal silhouettes. BONES AND SOFT TISSUES: No acute osseous abnormality. DIAPHRAGMS AND UPPER ABDOMEN: Air-fluid level in stomach beneath left hemidiaphragm. IMPRESSION: 1. No acute cardiopulmonary process identified. Electronically signed by: Birmingham Calk MD 03/14/2024 02:29 PM EDT RP Workstation: [id number]    Microbiology: Results for orders placed or performed during the hospital encounter of 09/28/23  Resp panel by RT-PCR (RSV, Flu A&B, Covid) Anterior Nasal Swab     Status: Abnormal   Collection Time: 09/27/23  6:53 PM   Specimen: Anterior Nasal Swab  Result Value Ref Range Status   SARS Coronavirus 2 by RT PCR NEGATIVE NEGATIVE Final    Comment: (NOTE) SARS-CoV-2 target nucleic acids are NOT DETECTED.  The SARS-CoV-2 RNA is generally detectable in upper respiratory specimens during the acute phase of infection. The lowest concentration of SARS-CoV-2 viral copies  this assay can detect is 138 copies/mL. A negative result does not preclude SARS-Cov-2 infection and should not be used as the sole basis for treatment or other patient management decisions. A negative result may occur with  improper specimen collection/handling, submission of specimen other than nasopharyngeal swab, presence of viral mutation(s) within the areas targeted by this assay, and inadequate number of viral copies(<138 copies/mL). A negative result must be combined with clinical observations, patient history, and epidemiological information. The expected result is Negative.  Fact Sheet for  Patients:  bloggercourse.com  Fact Sheet for Healthcare Providers:  seriousbroker.it  This test is no t yet approved or cleared by the [place]  FDA and  has been authorized for detection and/or diagnosis of SARS-CoV-2 by FDA under an Emergency Use Authorization (EUA). This EUA will remain  in effect (meaning this test can be used) for the duration of the COVID-19 declaration under Section 564(b)(1) of the Act, 21 U.S.C.section 360bbb-3(b)(1), unless the authorization is terminated  or revoked sooner.       Influenza A by PCR NEGATIVE NEGATIVE Final   Influenza B by PCR POSITIVE (A) NEGATIVE Final    Comment: (NOTE) The Xpert Xpress SARS-CoV-2/FLU/RSV plus assay is intended as an aid in the diagnosis of influenza from Nasopharyngeal swab specimens and should not be used as a sole basis for treatment. Nasal washings and aspirates are unacceptable for Xpert Xpress SARS-CoV-2/FLU/RSV testing.  Fact Sheet for Patients: bloggercourse.com  Fact Sheet for Healthcare Providers: seriousbroker.it  This test is not yet approved or cleared by the [place]  FDA and has been authorized for detection and/or diagnosis of SARS-CoV-2 by FDA under an Emergency Use Authorization (EUA). This EUA will remain in effect (meaning this test can be used) for the duration of the COVID-19 declaration under Section 564(b)(1) of the Act, 21 U.S.C. section 360bbb-3(b)(1), unless the authorization is terminated or revoked.     Resp Syncytial Virus by PCR NEGATIVE NEGATIVE Final    Comment: (NOTE) Fact Sheet for Patients: bloggercourse.com  Fact Sheet for Healthcare Providers: seriousbroker.it  This test is not yet approved or cleared by the [place]  FDA and has been authorized for detection and/or diagnosis of SARS-CoV-2 by FDA under an  Emergency Use Authorization (EUA). This EUA will remain in effect (meaning this test can be used) for the duration of the COVID-19 declaration under Section 564(b)(1) of the Act, 21 U.S.C. section 360bbb-3(b)(1), unless the authorization is terminated or revoked.  Performed at [facility], [address] [place], [place] [address]     Labs: CBC: Recent Labs  Lab 03/17/24 [phone] 03/18/24 0444 03/18/24 1716 03/19/24 0353  WBC 9.9 8.6 7.8 7.3  NEUTROABS  --   --  5.8  --   HGB 13.3 10.1* 10.8* 10.7*  HCT 39.6 29.6* 32.0* 31.5*  MCV 84.6 85.1 84.9 85.4  PLT 285 239 245 267   Basic Metabolic Panel: Recent Labs  Lab 03/17/24 0922 03/18/24 0444 03/18/24 1716 03/19/24 0353  NA 133* 138 139 135  K 4.5 3.8 3.6 3.6  CL 98 104 105 106  CO2 20* 23 19* 21*  GLUCOSE 435* 225* 217* 203*  BUN 17 16 14 13   CREATININE 0.75 0.83 0.61 0.74  CALCIUM 9.6 8.0* 8.1* 7.7*   Liver Function Tests: Recent Labs  Lab 03/17/24 0922 03/18/24 1716  AST 25 30  ALT 24 23  ALKPHOS 81 52  BILITOT 1.0 1.1  PROT 7.6 6.2*  ALBUMIN 2.9* 2.3*   CBG:  Recent Labs  Lab 03/19/24 1124 03/19/24 1619 03/19/24 2252 03/20/24 0745 03/20/24 1134  GLUCAP 209* 171* 117* 115* 130*    Discharge time spent: greater than 30 minutes.  Signed: Cresencio Fairly, MD [facility] 03/22/2024

## 2024-04-13 DIAGNOSIS — K5900 Constipation, unspecified: Secondary | ICD-10-CM | POA: Diagnosis not present

## 2024-04-13 DIAGNOSIS — Z0131 Encounter for examination of blood pressure with abnormal findings: Secondary | ICD-10-CM | POA: Diagnosis not present

## 2024-04-13 DIAGNOSIS — Z1331 Encounter for screening for depression: Secondary | ICD-10-CM | POA: Diagnosis not present

## 2024-04-13 DIAGNOSIS — Z1389 Encounter for screening for other disorder: Secondary | ICD-10-CM | POA: Diagnosis not present

## 2024-04-13 DIAGNOSIS — R809 Proteinuria, unspecified: Secondary | ICD-10-CM | POA: Diagnosis not present

## 2024-04-13 DIAGNOSIS — E1165 Type 2 diabetes mellitus with hyperglycemia: Secondary | ICD-10-CM | POA: Diagnosis not present

## 2024-04-13 DIAGNOSIS — I10 Essential (primary) hypertension: Secondary | ICD-10-CM | POA: Diagnosis not present

## 2024-04-17 ENCOUNTER — Emergency Department
Admission: EM | Admit: 2024-04-17 | Discharge: 2024-04-18 | Disposition: A | Attending: Emergency Medicine | Admitting: Emergency Medicine

## 2024-04-17 ENCOUNTER — Encounter: Payer: Self-pay | Admitting: Emergency Medicine

## 2024-04-17 ENCOUNTER — Emergency Department

## 2024-04-17 ENCOUNTER — Other Ambulatory Visit: Payer: Self-pay

## 2024-04-17 DIAGNOSIS — R1013 Epigastric pain: Secondary | ICD-10-CM | POA: Diagnosis not present

## 2024-04-17 DIAGNOSIS — R079 Chest pain, unspecified: Secondary | ICD-10-CM | POA: Diagnosis not present

## 2024-04-17 DIAGNOSIS — R0789 Other chest pain: Secondary | ICD-10-CM | POA: Diagnosis not present

## 2024-04-17 DIAGNOSIS — R112 Nausea with vomiting, unspecified: Secondary | ICD-10-CM | POA: Diagnosis present

## 2024-04-17 LAB — CBC
HCT: 35.3 % — ABNORMAL LOW (ref 36.0–46.0)
Hemoglobin: 11.8 g/dL — ABNORMAL LOW (ref 12.0–15.0)
MCH: 27.8 pg (ref 26.0–34.0)
MCHC: 33.4 g/dL (ref 30.0–36.0)
MCV: 83.1 fL (ref 80.0–100.0)
Platelets: 399 K/uL (ref 150–400)
RBC: 4.25 MIL/uL (ref 3.87–5.11)
RDW: 11.9 % (ref 11.5–15.5)
WBC: 8.8 K/uL (ref 4.0–10.5)
nRBC: 0 % (ref 0.0–0.2)

## 2024-04-17 LAB — LIPASE, BLOOD: Lipase: 17 U/L (ref 11–51)

## 2024-04-17 LAB — BASIC METABOLIC PANEL WITH GFR
Anion gap: 10 (ref 5–15)
BUN: 11 mg/dL (ref 6–20)
CO2: 24 mmol/L (ref 22–32)
Calcium: 8.4 mg/dL — ABNORMAL LOW (ref 8.9–10.3)
Chloride: 101 mmol/L (ref 98–111)
Creatinine, Ser: 0.74 mg/dL (ref 0.44–1.00)
GFR, Estimated: >60 mL/min (ref >60)
Glucose, Bld: 293 mg/dL — ABNORMAL HIGH (ref 70–99)
Potassium: 3.8 mmol/L (ref 3.5–5.1)
Sodium: 135 mmol/L (ref 135–145)

## 2024-04-17 LAB — TROPONIN T, HIGH SENSITIVITY
Troponin T High Sensitivity: <15 ng/L (ref 0–19)
Troponin T High Sensitivity: <15 ng/L (ref 0–19)

## 2024-04-17 LAB — POC URINE PREG, ED: Preg Test, Ur: NEGATIVE

## 2024-04-17 MED ORDER — ONDANSETRON HCL 4 MG/2ML IJ SOLN
4.0000 mg | Freq: Once | INTRAMUSCULAR | Status: AC
  Administered 2024-04-17: 4 mg via INTRAVENOUS
  Filled 2024-04-17: qty 2

## 2024-04-17 MED ORDER — LIDOCAINE VISCOUS HCL 2 % MT SOLN
15.0000 mL | Freq: Once | OROMUCOSAL | Status: AC
  Administered 2024-04-17: 15 mL via ORAL
  Filled 2024-04-17: qty 15

## 2024-04-17 MED ORDER — FAMOTIDINE 20 MG PO TABS
20.0000 mg | ORAL_TABLET | Freq: Once | ORAL | Status: AC
  Administered 2024-04-17: 20 mg via ORAL
  Filled 2024-04-17: qty 1

## 2024-04-17 MED ORDER — ONDANSETRON 4 MG PO TBDP
4.0000 mg | ORAL_TABLET | Freq: Once | ORAL | Status: AC | PRN
  Administered 2024-04-17: 4 mg via ORAL
  Filled 2024-04-17: qty 1

## 2024-04-17 MED ORDER — ALUM & MAG HYDROXIDE-SIMETH 200-200-20 MG/5ML PO SUSP
30.0000 mL | Freq: Once | ORAL | Status: AC
  Administered 2024-04-17: 30 mL via ORAL
  Filled 2024-04-17: qty 30

## 2024-04-17 NOTE — ED Triage Notes (Signed)
 Pt arrives POV ambulatory to triage, gait steady, no acute distress noted c/o chest pain that started this morning after waking up nauseated. pt has had n/v all day w/ some sob,lightheadedness. Pt seen here for same 1 month ago.

## 2024-04-17 NOTE — ED Provider Notes (Signed)
 [facility] Provider Note    Event Date/Time   First MD Initiated Contact with Patient 04/17/24 2210     (approximate)   History   Chest Pain   HPI  Carolyn Andrews is a 33 y.o. female who presents today with concern of nausea vomiting and epigastric discomfort.  Woke up this morning complaining of persistent nausea and multiple episodes of vomiting.  She was recently admitted for similar at that time it was believed that her symptoms were secondary to her new Ozempic use.  She states that since then she has stopped the Ozempic but the symptoms feel very similar to then.  She feels like she may have tried to eat too quickly and it feels like something may be stuck in her chest, but having no trouble tolerating secretions just persistent nausea and multiple episodes of vomiting.  Denies fevers chills, no abdominal surgical history.  No history of cardiac disease.  No associated shortness of breath.     Physical Exam   Triage Vital Signs: ED Triage Vitals  Encounter Vitals Group     BP 04/17/24 2056 (!) 202/80     Girls Systolic BP Percentile --      Girls Diastolic BP Percentile --      Boys Systolic BP Percentile --      Boys Diastolic BP Percentile --      Pulse Rate 04/17/24 2056 70     Resp 04/17/24 2056 18     Temp 04/17/24 2056 98.2 F (36.8 C)     Temp Source 04/17/24 2056 Oral     SpO2 04/17/24 2056 100 %     Weight --      Height --      Head Circumference --      Peak Flow --      Pain Score 04/17/24 2052 9     Pain Loc --      Pain Education --      Exclude from Growth Chart --     Most recent vital signs: Vitals:   04/17/24 2056  BP: (!) 202/80  Pulse: 70  Resp: 18  Temp: 98.2 F (36.8 C)  SpO2: 100%     General: Awake, no distress.  CV:  Good peripheral perfusion.  Resp:  Normal effort.  Abd:  No distention.  Soft nontender Other:     ED Results / Procedures / Treatments   Labs (all labs ordered are  listed, but only abnormal results are displayed) Labs Reviewed  BASIC METABOLIC PANEL WITH GFR - Abnormal; Notable for the following components:      Result Value   Glucose, Bld 293 (*)    Calcium  8.4 (*)    All other components within normal limits  CBC - Abnormal; Notable for the following components:   Hemoglobin 11.8 (*)    HCT 35.3 (*)    All other components within normal limits  LIPASE, BLOOD  POC URINE PREG, ED  TROPONIN T, HIGH SENSITIVITY  TROPONIN T, HIGH SENSITIVITY     EKG  Sinus rhythm with a rate of about 75, axis of 60, intervals appear to be within normal limits with the presence of a right bundle branch block, no obvious ischemia that I appreciate on this EKG   RADIOLOGY No acute cardiopulmonary process  PROCEDURES:  Critical Care performed: No  Procedures   MEDICATIONS ORDERED IN ED: Medications  ondansetron  (ZOFRAN ) injection 4 mg (has no administration in time range)  alum &  mag hydroxide-simeth (MAALOX/MYLANTA) 200-200-20 MG/5ML suspension 30 mL (has no administration in time range)    And  lidocaine  (XYLOCAINE ) 2 % viscous mouth solution 15 mL (has no administration in time range)  famotidine  (PEPCID ) tablet 20 mg (has no administration in time range)  ondansetron  (ZOFRAN -ODT) disintegrating tablet 4 mg (4 mg Oral Given 04/17/24 2059)     IMPRESSION / MDM / ASSESSMENT AND PLAN / ED COURSE  I reviewed the triage vital signs and the nursing notes.                               Patient's presentation is most consistent with acute, uncomplicated illness.  33 year old female who presents today with concern of nausea vomiting and abdominal discomfort as well as chest discomfort.  She appears well at rest, her labs are also reassuring her troponin is negative her EKG is nonischemic and her chest x-ray is without acute findings.  She was initially hypertensive but blood pressure is also improved while here.  Given the clinical exam I suspect likely  reflux versus possibly gastroenteritis, I will attempt symptomatic management and determine safe disposition accordingly.       FINAL CLINICAL IMPRESSION(S) / ED DIAGNOSES   Final diagnoses:  Nausea and vomiting, unspecified vomiting type     Rx / DC Orders   ED Discharge Orders     None        Note:  This document was prepared using Dragon voice recognition software and may include unintentional dictation errors.   Fernand Rossie HERO, MD 04/17/24 2322

## 2024-04-18 DIAGNOSIS — K3184 Gastroparesis: Secondary | ICD-10-CM | POA: Diagnosis not present

## 2024-04-18 DIAGNOSIS — M25532 Pain in left wrist: Secondary | ICD-10-CM | POA: Diagnosis not present

## 2024-04-18 DIAGNOSIS — E785 Hyperlipidemia, unspecified: Secondary | ICD-10-CM | POA: Diagnosis not present

## 2024-04-18 DIAGNOSIS — E1122 Type 2 diabetes mellitus with diabetic chronic kidney disease: Secondary | ICD-10-CM | POA: Diagnosis not present

## 2024-04-18 DIAGNOSIS — R1013 Epigastric pain: Secondary | ICD-10-CM | POA: Diagnosis not present

## 2024-04-18 DIAGNOSIS — Z79899 Other long term (current) drug therapy: Secondary | ICD-10-CM | POA: Diagnosis not present

## 2024-04-18 DIAGNOSIS — Z888 Allergy status to other drugs, medicaments and biological substances status: Secondary | ICD-10-CM | POA: Diagnosis not present

## 2024-04-18 DIAGNOSIS — Z794 Long term (current) use of insulin: Secondary | ICD-10-CM | POA: Diagnosis not present

## 2024-04-18 DIAGNOSIS — I129 Hypertensive chronic kidney disease with stage 1 through stage 4 chronic kidney disease, or unspecified chronic kidney disease: Secondary | ICD-10-CM | POA: Diagnosis not present

## 2024-04-18 DIAGNOSIS — E1143 Type 2 diabetes mellitus with diabetic autonomic (poly)neuropathy: Secondary | ICD-10-CM | POA: Diagnosis not present

## 2024-04-18 DIAGNOSIS — N189 Chronic kidney disease, unspecified: Secondary | ICD-10-CM | POA: Diagnosis not present

## 2024-04-18 DIAGNOSIS — Z7984 Long term (current) use of oral hypoglycemic drugs: Secondary | ICD-10-CM | POA: Diagnosis not present

## 2024-04-18 LAB — HEPATIC FUNCTION PANEL
ALT: 22 U/L (ref 0–44)
AST: 21 U/L (ref 15–41)
Albumin: 2.9 g/dL — ABNORMAL LOW (ref 3.5–5.0)
Alkaline Phosphatase: 82 U/L (ref 38–126)
Bilirubin, Direct: 0.1 mg/dL (ref 0.0–0.2)
Indirect Bilirubin: 0.2 mg/dL — ABNORMAL LOW (ref 0.3–0.9)
Total Bilirubin: 0.4 mg/dL (ref 0.0–1.2)
Total Protein: 6.3 g/dL — ABNORMAL LOW (ref 6.5–8.1)

## 2024-04-18 MED ORDER — METOCLOPRAMIDE HCL 10 MG PO TABS: 10.0000 mg | ORAL_TABLET | Freq: Three times a day (TID) | ORAL | 1 refills | Status: AC | PRN

## 2024-04-18 MED ORDER — PANTOPRAZOLE SODIUM 40 MG IV SOLR
80.0000 mg | Freq: Once | INTRAVENOUS | Status: AC
  Administered 2024-04-18: 80 mg via INTRAVENOUS
  Filled 2024-04-18: qty 20

## 2024-04-18 MED ORDER — ALUM & MAG HYDROXIDE-SIMETH 200-200-20 MG/5ML PO SUSP
30.0000 mL | Freq: Once | ORAL | Status: DC
  Filled 2024-04-18: qty 30

## 2024-04-18 MED ORDER — LIDOCAINE VISCOUS HCL 2 % MT SOLN
15.0000 mL | Freq: Once | OROMUCOSAL | Status: DC
  Filled 2024-04-18: qty 15

## 2024-04-18 MED ORDER — METOCLOPRAMIDE HCL 5 MG/ML IJ SOLN
10.0000 mg | Freq: Once | INTRAMUSCULAR | Status: AC
  Administered 2024-04-18: 10 mg via INTRAVENOUS
  Filled 2024-04-18: qty 2

## 2024-04-18 MED ORDER — DROPERIDOL 2.5 MG/ML IJ SOLN
2.5000 mg | Freq: Once | INTRAMUSCULAR | Status: DC
  Filled 2024-04-18: qty 2

## 2024-04-18 MED ORDER — PANTOPRAZOLE SODIUM 40 MG PO TBEC: 40.0000 mg | DELAYED_RELEASE_TABLET | Freq: Every day | ORAL | 1 refills | Status: AC

## 2024-04-18 NOTE — ED Provider Notes (Signed)
 11:10 PM Assumed care at shift change.    Patient here with upper abdominal pain, chest pain, nausea and vomiting.  History of the same with reassuring workup including negative CT abdomen pelvis 12/26/23 and 03/18/24, negative right upper quadrant ultrasound 12/26/23, recent normal endoscopy other than mild erythematous mucosa at the greater curvature of the gastric body (03/19/24 by Unk).  Symptoms attributed to Ozempic but states she has been off of this for the past month.  She denies any marijuana or cannabinoid use (negative on UDS 03/17/24).  Patient hypertensive on arrival.  Blood pressure will need to be rechecked.  Will need to be reassessed after GI cocktail.  Labs today reassuring with no leukocytosis, normal electrolytes and creatinine, negative pregnancy test.   12:15 AM  BP improved.  Pain still 9/10 and actively vomiting.  Reports no significant improvement.  She is requesting what ever medications we gave her during her last hospitalization but states it was not morphine .  It appears she was given Reglan  and Protonix .  Will give these medications and then p.o. challenge.   12:55 AM  Pt reports feeling better after reglan  and protonix .  Tolerating p.o. now.  Will discharge with prescriptions of the same and recommended she follow-up with her gastroenterologist.  Recommended a bland diet.  She verbalized understanding.   At this time, I do not feel there is any life-threatening condition present. I reviewed all nursing notes, vitals, pertinent previous records.  All lab and urine results, EKGs, imaging ordered have been independently reviewed and interpreted by myself.  I reviewed all available radiology reports from any imaging ordered this visit.  Based on my assessment, I feel the patient is safe to be discharged home without further emergent workup and can continue workup as an outpatient as needed. Discussed all findings, treatment plan as well as usual and customary return  precautions.  They verbalize understanding and are comfortable with this plan.  Outpatient follow-up has been provided as needed.  All questions have been answered.    Luz Burcher, Josette SAILOR, DO 04/18/24 0400

## 2024-04-18 NOTE — ED Notes (Signed)
 AVD provided by edp was reviewed with pt. Pt verbalized understanding with no additional questions at this time.

## 2024-06-14 ENCOUNTER — Emergency Department
Admission: EM | Admit: 2024-06-14 | Discharge: 2024-06-15 | Disposition: A | Discharge status: Home / Self Care | Attending: Emergency Medicine | Admitting: Emergency Medicine

## 2024-06-14 ENCOUNTER — Other Ambulatory Visit: Payer: Self-pay

## 2024-06-14 DIAGNOSIS — D72829 Elevated white blood cell count, unspecified: Secondary | ICD-10-CM | POA: Diagnosis not present

## 2024-06-14 DIAGNOSIS — E871 Hypo-osmolality and hyponatremia: Secondary | ICD-10-CM | POA: Diagnosis not present

## 2024-06-14 DIAGNOSIS — R112 Nausea with vomiting, unspecified: Secondary | ICD-10-CM | POA: Insufficient documentation

## 2024-06-14 DIAGNOSIS — I10 Essential (primary) hypertension: Secondary | ICD-10-CM | POA: Diagnosis not present

## 2024-06-14 DIAGNOSIS — E1165 Type 2 diabetes mellitus with hyperglycemia: Secondary | ICD-10-CM | POA: Diagnosis not present

## 2024-06-14 DIAGNOSIS — E119 Type 2 diabetes mellitus without complications: Secondary | ICD-10-CM | POA: Insufficient documentation

## 2024-06-14 DIAGNOSIS — R1013 Epigastric pain: Secondary | ICD-10-CM | POA: Diagnosis present

## 2024-06-14 LAB — CBC
HCT: 40.5 % (ref 36.0–46.0)
Hemoglobin: 13.4 g/dL (ref 12.0–15.0)
MCH: 27.1 pg (ref 26.0–34.0)
MCHC: 33.1 g/dL (ref 30.0–36.0)
MCV: 81.8 fL (ref 80.0–100.0)
Platelets: 369 K/uL (ref 150–400)
RBC: 4.95 MIL/uL (ref 3.87–5.11)
RDW: 12.8 % (ref 11.5–15.5)
WBC: 12.2 K/uL — ABNORMAL HIGH (ref 4.0–10.5)
nRBC: 0 % (ref 0.0–0.2)

## 2024-06-14 LAB — LIPASE, BLOOD: Lipase: 39 U/L (ref 11–51)

## 2024-06-14 LAB — COMPREHENSIVE METABOLIC PANEL WITH GFR
ALT: 10 U/L (ref 0–44)
AST: 18 U/L (ref 15–41)
Albumin: 2.7 g/dL — ABNORMAL LOW (ref 3.5–5.0)
Alkaline Phosphatase: 89 U/L (ref 38–126)
Anion gap: 12 (ref 5–15)
BUN: 12 mg/dL (ref 6–20)
CO2: 24 mmol/L (ref 22–32)
Calcium: 8.9 mg/dL (ref 8.9–10.3)
Chloride: 98 mmol/L (ref 98–111)
Creatinine, Ser: 0.98 mg/dL (ref 0.44–1.00)
GFR, Estimated: >60 mL/min
Glucose, Bld: 366 mg/dL — ABNORMAL HIGH (ref 70–99)
Potassium: 4.5 mmol/L (ref 3.5–5.1)
Sodium: 133 mmol/L — ABNORMAL LOW (ref 135–145)
Total Bilirubin: 0.4 mg/dL (ref 0.0–1.2)
Total Protein: 6.2 g/dL — ABNORMAL LOW (ref 6.5–8.1)

## 2024-06-14 MED ORDER — LACTATED RINGERS IV BOLUS
1000.0000 mL | Freq: Once | INTRAVENOUS | Status: AC
  Administered 2024-06-14: 1000 mL via INTRAVENOUS

## 2024-06-14 MED ORDER — ONDANSETRON HCL 4 MG/2ML IJ SOLN
4.0000 mg | Freq: Once | INTRAMUSCULAR | Status: AC
  Administered 2024-06-14: 4 mg via INTRAVENOUS
  Filled 2024-06-14: qty 2

## 2024-06-14 MED ORDER — ONDANSETRON 4 MG PO TBDP: 4.0000 mg | ORAL_TABLET | Freq: Three times a day (TID) | ORAL | 0 refills | Status: DC | PRN

## 2024-06-14 MED ORDER — DROPERIDOL 2.5 MG/ML IJ SOLN
2.5000 mg | Freq: Once | INTRAMUSCULAR | Status: AC
  Administered 2024-06-14: 2.5 mg via INTRAVENOUS
  Filled 2024-06-14: qty 2

## 2024-06-14 MED ORDER — MORPHINE SULFATE (PF) 4 MG/ML IV SOLN
4.0000 mg | Freq: Once | INTRAVENOUS | Status: AC
  Administered 2024-06-14: 4 mg via INTRAVENOUS
  Filled 2024-06-14: qty 1

## 2024-06-14 NOTE — ED Triage Notes (Signed)
 Generalized abd pain with n/v x 2 days. Denies cough, congestion, body aches, fever, diarrhea. No known sick contact. States has been unable to take home meds d/t n/v. Pt ambulatory to triage. Alert and oriented following commands. Breathing unlabored speaking in full sentences. Symmetric chest rise and fall. Pt denies urinary symptoms.

## 2024-06-14 NOTE — ED Notes (Signed)
 Pt states she was unable to pass PO challenge. Vomited after ingestion. MD made aware

## 2024-06-14 NOTE — ED Notes (Signed)
 Pt provided crackers and water per request for PO challenge

## 2024-06-14 NOTE — ED Provider Notes (Signed)
 "  [facility] Provider Note    Event Date/Time   First MD Initiated Contact with Patient 06/14/24 2133     (approximate)   History   Chief Complaint Abdominal Pain   HPI  Carolyn Andrews is a 34 y.o. female with past medical history of hypertension, hyperlipidemia, and diabetes who presents to the ED complaining of abdominal pain.  Patient reports that she has had 2 days of increasing pain in her epigastrium associated with nausea and vomiting.  She has been unable to keep anything down during this time, denies any associated diarrhea.  She has not had any fevers, dysuria, or flank pain.  She does describe symptoms as similar to prior episodes.     Physical Exam   Triage Vital Signs: ED Triage Vitals  Encounter Vitals Group     BP 06/14/24 2134 (!) 222/96     Girls Systolic BP Percentile --      Girls Diastolic BP Percentile --      Boys Systolic BP Percentile --      Boys Diastolic BP Percentile --      Pulse Rate 06/14/24 2134 83     Resp 06/14/24 2134 20     Temp --      Temp src --      SpO2 06/14/24 2134 100 %     Weight --      Height --      Head Circumference --      Peak Flow --      Pain Score 06/14/24 2135 8     Pain Loc --      Pain Education --      Exclude from Growth Chart --     Most recent vital signs: Vitals:   06/14/24 2134 06/14/24 2200  BP: (!) 222/96 (!) 196/87  Pulse: 83 78  Resp: 20   SpO2: 100% 100%    Constitutional: Alert and oriented. Eyes: Conjunctivae are normal. Head: Atraumatic. Nose: No congestion/rhinnorhea. Mouth/Throat: Mucous membranes are moist.  Cardiovascular: Normal rate, regular rhythm. Grossly normal heart sounds.  2+ radial pulses bilaterally. Respiratory: Normal respiratory effort.  No retractions. Lungs CTAB. Gastrointestinal: Soft and tender to palpation in the epigastrium with no rebound or guarding. No distention. Musculoskeletal: No lower extremity tenderness nor edema.   Neurologic:  Normal speech and language. No gross focal neurologic deficits are appreciated.    ED Results / Procedures / Treatments   Labs (all labs ordered are listed, but only abnormal results are displayed) Labs Reviewed  COMPREHENSIVE METABOLIC PANEL WITH GFR - Abnormal; Notable for the following components:      Result Value   Sodium 133 (*)    Glucose, Bld 366 (*)    Total Protein 6.2 (*)    Albumin 2.7 (*)    All other components within normal limits  CBC - Abnormal; Notable for the following components:   WBC 12.2 (*)    All other components within normal limits  LIPASE, BLOOD  URINALYSIS, ROUTINE W REFLEX MICROSCOPIC  POC URINE PREG, ED    PROCEDURES:  Critical Care performed: No  Procedures   MEDICATIONS ORDERED IN ED: Medications  droperidol  (INAPSINE ) 2.5 MG/ML injection 2.5 mg (has no administration in time range)  morphine  (PF) 4 MG/ML injection 4 mg (4 mg Intravenous Given 06/14/24 2248)  ondansetron  (ZOFRAN ) injection 4 mg (4 mg Intravenous Given 06/14/24 2248)  lactated ringers  bolus 1,000 mL (1,000 mLs Intravenous New Bag/Given 06/14/24 2248)  IMPRESSION / MDM / ASSESSMENT AND PLAN / ED COURSE  I reviewed the triage vital signs and the nursing notes.                              34 y.o. female with past medical history of hypertension, hyperlipidemia, and diabetes who presents to the ED complaining of 2 days of persistent nausea, vomiting, and epigastric pain.  Patient's presentation is most consistent with acute presentation with potential threat to life or bodily function.  Differential diagnosis includes, but is not limited to, gastritis, pancreatitis, hepatitis, cholecystitis, biliary colic, cannabinoid hyperemesis, chronic abdominal pain.  Patient nontoxic-appearing and in no acute distress, vital signs are remarkable for hypertension but otherwise reassuring.  Her abdomen is soft with some tenderness in the epigastrium, however this  appears to be a recurrent issue for the patient with normal CT imaging in the past, do not feel repeat CT imaging needed at this time.  Labs with hyperglycemia but no evidence of DKA, no significant anemia, leukocytosis, or AKI noted.  LFTs and lipase are also unremarkable, will treat symptomatically with IV morphine  and Zofran , hydrate with IV fluids and reassess.  Patient continues to feel nauseous despite Zofran , has been unable to tolerate oral intake.  We will treat with IV droperidol  and patient turned over to oncoming provider pending reassessment and p.o. challenge.      FINAL CLINICAL IMPRESSION(S) / ED DIAGNOSES   Final diagnoses:  Epigastric pain  Nausea and vomiting, unspecified vomiting type     Rx / DC Orders   ED Discharge Orders          Ordered    ondansetron  (ZOFRAN -ODT) 4 MG disintegrating tablet  Every 8 hours PRN        06/14/24 2334             Note:  This document was prepared using Dragon voice recognition software and may include unintentional dictation errors.   Willo Dunnings, MD 06/14/24 [phone]  "

## 2024-06-14 NOTE — ED Provider Notes (Signed)
.----------------------------------------- °  11:43 PM on 06/14/2024 -----------------------------------------  Blood pressure (!) 196/87, pulse 78, resp. rate 20, SpO2 100%.   In short, Carolyn Andrews is a 34 y.o. female with a chief complaint of nausea vomiting, epigastric abdominal pain.  Refer to the original H&P for additional details.  The current plan of care is to reassess after droperidol .  Was monitored after droperidol , feeling significantly better, tolerating p.o., asking to go home since she is feeling significantly better.  She was prescribed Zofran  for home by prior MD.  We also gave her her missed amlodipine .  Her blood pressure is asymptomatic.  No further workup needed.  Considered but no indication for inpatient admission at this time, she safe for outpatient management.  Discharged with strict return precautions.     Medications  amLODipine  (NORVASC ) tablet 10 mg (has no administration in time range)  morphine  (PF) 4 MG/ML injection 4 mg (4 mg Intravenous Given 06/14/24 2248)  ondansetron  (ZOFRAN ) injection 4 mg (4 mg Intravenous Given 06/14/24 2248)  lactated ringers  bolus 1,000 mL (1,000 mLs Intravenous New Bag/Given 06/14/24 2248)  droperidol  (INAPSINE ) 2.5 MG/ML injection 2.5 mg (2.5 mg Intravenous Given 06/14/24 2356)     ED Discharge Orders          Ordered    ondansetron  (ZOFRAN -ODT) 4 MG disintegrating tablet  Every 8 hours PRN        06/14/24 2334           Final diagnoses:  Epigastric pain  Nausea and vomiting, unspecified vomiting type      Waymond Lorelle Cummins, MD 06/15/24 [phone]

## 2024-06-15 ENCOUNTER — Other Ambulatory Visit: Payer: Self-pay

## 2024-06-15 ENCOUNTER — Emergency Department
Admission: EM | Admit: 2024-06-15 | Discharge: 2024-06-15 | Disposition: A | Discharge status: Home / Self Care | Attending: Emergency Medicine | Admitting: Emergency Medicine

## 2024-06-15 DIAGNOSIS — I10 Essential (primary) hypertension: Secondary | ICD-10-CM | POA: Insufficient documentation

## 2024-06-15 DIAGNOSIS — R1013 Epigastric pain: Secondary | ICD-10-CM | POA: Insufficient documentation

## 2024-06-15 DIAGNOSIS — E1165 Type 2 diabetes mellitus with hyperglycemia: Secondary | ICD-10-CM | POA: Insufficient documentation

## 2024-06-15 DIAGNOSIS — R112 Nausea with vomiting, unspecified: Secondary | ICD-10-CM | POA: Insufficient documentation

## 2024-06-15 DIAGNOSIS — D72829 Elevated white blood cell count, unspecified: Secondary | ICD-10-CM | POA: Insufficient documentation

## 2024-06-15 DIAGNOSIS — E871 Hypo-osmolality and hyponatremia: Secondary | ICD-10-CM | POA: Insufficient documentation

## 2024-06-15 LAB — CBC
HCT: 40.8 % (ref 36.0–46.0)
Hemoglobin: 13.4 g/dL (ref 12.0–15.0)
MCH: 26.6 pg (ref 26.0–34.0)
MCHC: 32.8 g/dL (ref 30.0–36.0)
MCV: 81.1 fL (ref 80.0–100.0)
Platelets: 371 K/uL (ref 150–400)
RBC: 5.03 MIL/uL (ref 3.87–5.11)
RDW: 12.7 % (ref 11.5–15.5)
WBC: 11 K/uL — ABNORMAL HIGH (ref 4.0–10.5)
nRBC: 0 % (ref 0.0–0.2)

## 2024-06-15 LAB — COMPREHENSIVE METABOLIC PANEL WITH GFR
ALT: 15 U/L (ref 0–44)
AST: 24 U/L (ref 15–41)
Albumin: 2.8 g/dL — ABNORMAL LOW (ref 3.5–5.0)
Alkaline Phosphatase: 96 U/L (ref 38–126)
Anion gap: 14 (ref 5–15)
BUN: 15 mg/dL (ref 6–20)
CO2: 23 mmol/L (ref 22–32)
Calcium: 8.5 mg/dL — ABNORMAL LOW (ref 8.9–10.3)
Chloride: 94 mmol/L — ABNORMAL LOW (ref 98–111)
Creatinine, Ser: 1.07 mg/dL — ABNORMAL HIGH (ref 0.44–1.00)
GFR, Estimated: >60 mL/min
Glucose, Bld: 510 mg/dL (ref 70–99)
Potassium: 4.8 mmol/L (ref 3.5–5.1)
Sodium: 131 mmol/L — ABNORMAL LOW (ref 135–145)
Total Bilirubin: 0.4 mg/dL (ref 0.0–1.2)
Total Protein: 6.3 g/dL — ABNORMAL LOW (ref 6.5–8.1)

## 2024-06-15 LAB — CBG MONITORING, ED
Glucose-Capillary: 380 mg/dL — ABNORMAL HIGH (ref 70–99)
Glucose-Capillary: 327 mg/dL — ABNORMAL HIGH (ref 70–99)

## 2024-06-15 LAB — LIPASE, BLOOD: Lipase: 14 U/L (ref 11–51)

## 2024-06-15 MED ORDER — SODIUM CHLORIDE 0.9 % IV SOLN: Freq: Once | INTRAVENOUS | Status: AC

## 2024-06-15 MED ORDER — INSULIN ASPART 100 UNIT/ML IJ SOLN
10.0000 [IU] | Freq: Once | INTRAMUSCULAR | Status: AC
  Administered 2024-06-15: 10 [IU] via INTRAVENOUS
  Filled 2024-06-15: qty 10

## 2024-06-15 MED ORDER — METOCLOPRAMIDE HCL 5 MG/ML IJ SOLN
10.0000 mg | Freq: Once | INTRAMUSCULAR | Status: AC
  Administered 2024-06-15: 10 mg via INTRAVENOUS
  Filled 2024-06-15: qty 2

## 2024-06-15 MED ORDER — MORPHINE SULFATE (PF) 4 MG/ML IV SOLN: 4.0000 mg | Freq: Once | INTRAVENOUS | Status: DC

## 2024-06-15 MED ORDER — ONDANSETRON 4 MG PO TBDP: 4.0000 mg | ORAL_TABLET | Freq: Three times a day (TID) | ORAL | 0 refills | Status: AC | PRN

## 2024-06-15 MED ORDER — AMLODIPINE BESYLATE 5 MG PO TABS: 10.0000 mg | ORAL_TABLET | Freq: Once | ORAL | Status: DC

## 2024-06-15 MED ORDER — SODIUM CHLORIDE 0.9 % IV BOLUS
1000.0000 mL | Freq: Once | INTRAVENOUS | Status: AC
  Administered 2024-06-15: 1000 mL via INTRAVENOUS

## 2024-06-15 MED ORDER — TRAMADOL HCL 50 MG PO TABS: 50.0000 mg | ORAL_TABLET | Freq: Four times a day (QID) | ORAL | 0 refills | Status: AC | PRN

## 2024-06-15 MED ORDER — ONDANSETRON HCL 4 MG/2ML IJ SOLN
4.0000 mg | Freq: Once | INTRAMUSCULAR | Status: AC
  Administered 2024-06-15: 4 mg via INTRAVENOUS
  Filled 2024-06-15: qty 2

## 2024-06-15 MED ORDER — MORPHINE SULFATE (PF) 4 MG/ML IV SOLN
4.0000 mg | Freq: Once | INTRAVENOUS | Status: AC
  Administered 2024-06-15: 4 mg via INTRAVENOUS
  Filled 2024-06-15: qty 1

## 2024-06-15 NOTE — ED Notes (Signed)
 No further emesis after PO challenge. Pt states she is feeling better and ready to go home. MD made aware

## 2024-06-15 NOTE — ED Triage Notes (Signed)
 C/O generalized abd pain and nausea x 3 days. SEen last night through ED for same. Patient states no improvement. Has not picked up RX from [facility] yet today.  AAOx3. Skin warm and dry. NAD

## 2024-06-15 NOTE — ED Provider Notes (Signed)
" °  Physical Exam  BP (!) 184/86 (BP Location: Right Arm)   Pulse 83   Temp 98.4 F (36.9 C) (Oral)   Resp 16   Wt 86 kg   LMP 06/01/2024   SpO2 99%   BMI 37.03 kg/m   Physical Exam  Procedures  Procedures  ED Course / MDM    Medical Decision Making Amount and/or Complexity of Data Reviewed Labs: ordered.  Risk Prescription drug management.   Assuming care from outgoing provider.  Plan at this time is to evaluate patient's glucose to make sure levels out below 300.  Patient is now complaining of pain.  Will give her morphine  4 mg IV.  This seems to be her normal pattern for her previous visits also.  Most likely has gastroparesis.  Patient's glucose did decrease to 325 after the second liter of fluid.  Will discharge her to home with some Zofran , tramadol .  She is to follow-up with her regular doctor as needed.  Return if worsening.  She is in agreement treatment plan.  Discharged stable condition.     Gasper Devere ORN, PA-C 06/15/24 1536    Arlander Charleston, MD 06/18/24 [phone]  "

## 2024-06-15 NOTE — ED Provider Notes (Signed)
 "  [facility] Provider Note    Event Date/Time   First MD Initiated Contact with Patient 06/15/24 1057     (approximate)   History   Nausea and vomiting   HPI  Carolyn Andrews is a 34 y.o. female with a history of diabetes, hypertension who presents with complaints of nausea and vomiting, this is a frequent issue for her.  Review of records suggests likely cyclical vomiting syndrome possibly related to diabetic gastroparesis.  Has had several CT scans in the last 6 months all of which have been reassuring as well as a normal ultrasound.     Physical Exam   Triage Vital Signs: ED Triage Vitals  Encounter Vitals Group     BP 06/15/24 1036 (!) 166/89     Girls Systolic BP Percentile --      Girls Diastolic BP Percentile --      Boys Systolic BP Percentile --      Boys Diastolic BP Percentile --      Pulse Rate 06/15/24 1036 85     Resp 06/15/24 1036 16     Temp 06/15/24 1036 98.4 F (36.9 C)     Temp Source 06/15/24 1036 Oral     SpO2 06/15/24 1036 95 %     Weight 06/15/24 1037 86 kg (189 lb 9.5 oz)     Height --      Head Circumference --      Peak Flow --      Pain Score 06/15/24 1036 9     Pain Loc --      Pain Education --      Exclude from Growth Chart --     Most recent vital signs: Vitals:   06/15/24 1036  BP: (!) 166/89  Pulse: 85  Resp: 16  Temp: 98.4 F (36.9 C)  SpO2: 95%     General: Awake, no distress.  CV:  Good peripheral perfusion.  Resp:  Normal effort.  Abd:  No distention.  Soft, nontender, reassuring exam Other:     ED Results / Procedures / Treatments   Labs (all labs ordered are listed, but only abnormal results are displayed) Labs Reviewed  CBC - Abnormal; Notable for the following components:      Result Value   WBC 11.0 (*)    All other components within normal limits  COMPREHENSIVE METABOLIC PANEL WITH GFR - Abnormal; Notable for the following components:   Sodium 131 (*)    Chloride 94  (*)    Glucose, Bld 510 (*)    Creatinine, Ser 1.07 (*)    Calcium  8.5 (*)    Total Protein 6.3 (*)    Albumin 2.8 (*)    All other components within normal limits  LIPASE, BLOOD     EKG     RADIOLOGY     PROCEDURES:  Critical Care performed:   Procedures   MEDICATIONS ORDERED IN ED: Medications  ondansetron  (ZOFRAN ) injection 4 mg (4 mg Intravenous Given 06/15/24 1157)  0.9 %  sodium chloride  infusion ( Intravenous New Bag/Given 06/15/24 1157)  insulin  aspart (novoLOG ) injection 10 Units (10 Units Intravenous Given 06/15/24 1258)     IMPRESSION / MDM / ASSESSMENT AND PLAN / ED COURSE  I reviewed the triage vital signs and the nursing notes. Patient's presentation is most consistent with exacerbation of chronic illness.  Patient presents with nausea vomiting or 2 prior episodes.  Her abdominal exam is reassuring.  Will treat with IV Zofran .  Do not see benefit in adding narcotics given the chronicity of this issue  Her glucose is elevated at 510 but anion gap CO2 is normal, will give IV insulin  in addition to the IV fluid she is receiving.  I have asked my colleague to reevaluate to make sure glucose improving          FINAL CLINICAL IMPRESSION(S) / ED DIAGNOSES   Final diagnoses:  Nausea and vomiting, unspecified vomiting type     Rx / DC Orders   ED Discharge Orders     None        Note:  This document was prepared using Dragon voice recognition software and may include unintentional dictation errors.   Arlander Charleston, MD 06/15/24 1307  "

## 2024-06-15 NOTE — ED Notes (Signed)
 No more emesis since last medication administration. Reprovided crackers and water for PO challenge

## 2024-06-15 NOTE — ED Notes (Signed)
 Dr. Arlander aware of glucose of 510.

## 2024-06-15 NOTE — ED Notes (Signed)
 Patient asked about pain meds. Dr. Arlander aware.

## 2024-06-15 NOTE — ED Notes (Signed)
 Patient has dry heaves after morphine  administration. Devere Perry PA-C aware.

## 2024-06-28 ENCOUNTER — Other Ambulatory Visit: Payer: Self-pay

## 2024-06-28 ENCOUNTER — Emergency Department
Admission: EM | Admit: 2024-06-28 | Discharge: 2024-06-28 | Disposition: A | Discharge status: Home / Self Care | Attending: Emergency Medicine | Admitting: Emergency Medicine

## 2024-06-28 ENCOUNTER — Encounter: Payer: Self-pay | Admitting: *Deleted

## 2024-06-28 ENCOUNTER — Emergency Department

## 2024-06-28 DIAGNOSIS — K3184 Gastroparesis: Secondary | ICD-10-CM | POA: Insufficient documentation

## 2024-06-28 DIAGNOSIS — E1143 Type 2 diabetes mellitus with diabetic autonomic (poly)neuropathy: Secondary | ICD-10-CM | POA: Insufficient documentation

## 2024-06-28 LAB — COMPREHENSIVE METABOLIC PANEL WITH GFR
ALT: 13 U/L (ref 0–44)
AST: 12 U/L — ABNORMAL LOW (ref 15–41)
Albumin: 3 g/dL — ABNORMAL LOW (ref 3.5–5.0)
Alkaline Phosphatase: 96 U/L (ref 38–126)
Anion gap: 9 (ref 5–15)
BUN: 22 mg/dL — ABNORMAL HIGH (ref 6–20)
CO2: 29 mmol/L (ref 22–32)
Calcium: 9.4 mg/dL (ref 8.9–10.3)
Chloride: 90 mmol/L — ABNORMAL LOW (ref 98–111)
Creatinine, Ser: 1.28 mg/dL — ABNORMAL HIGH (ref 0.44–1.00)
GFR, Estimated: 56 mL/min — ABNORMAL LOW
Glucose, Bld: 636 mg/dL (ref 70–99)
Potassium: 4.4 mmol/L (ref 3.5–5.1)
Sodium: 128 mmol/L — ABNORMAL LOW (ref 135–145)
Total Bilirubin: 0.5 mg/dL (ref 0.0–1.2)
Total Protein: 6.4 g/dL — ABNORMAL LOW (ref 6.5–8.1)

## 2024-06-28 LAB — URINALYSIS, ROUTINE W REFLEX MICROSCOPIC
Bilirubin Urine: NEGATIVE
Glucose, UA: >=500 mg/dL — AB
Hgb urine dipstick: NEGATIVE
Ketones, ur: NEGATIVE mg/dL
Leukocytes,Ua: NEGATIVE
Nitrite: NEGATIVE
Protein, ur: >=300 mg/dL — AB
Specific Gravity, Urine: 1.011 (ref 1.005–1.030)
pH: 7 (ref 5.0–8.0)

## 2024-06-28 LAB — CBC
HCT: 38 % (ref 36.0–46.0)
Hemoglobin: 12.9 g/dL (ref 12.0–15.0)
MCH: 27.3 pg (ref 26.0–34.0)
MCHC: 33.9 g/dL (ref 30.0–36.0)
MCV: 80.3 fL (ref 80.0–100.0)
Platelets: 321 10*3/uL (ref 150–400)
RBC: 4.73 MIL/uL (ref 3.87–5.11)
RDW: 12.7 % (ref 11.5–15.5)
WBC: 9.6 10*3/uL (ref 4.0–10.5)
nRBC: 0 % (ref 0.0–0.2)

## 2024-06-28 LAB — CBG MONITORING, ED: Glucose-Capillary: 325 mg/dL — ABNORMAL HIGH (ref 70–99)

## 2024-06-28 LAB — LIPASE, BLOOD: Lipase: 30 U/L (ref 11–51)

## 2024-06-28 MED ORDER — SODIUM CHLORIDE 0.9 % IV SOLN: Freq: Once | INTRAVENOUS | Status: AC

## 2024-06-28 MED ORDER — ONDANSETRON HCL 4 MG/2ML IJ SOLN
4.0000 mg | Freq: Once | INTRAMUSCULAR | Status: AC
  Administered 2024-06-28: 4 mg via INTRAVENOUS
  Filled 2024-06-28: qty 2

## 2024-06-28 MED ORDER — INSULIN ASPART 100 UNIT/ML IJ SOLN
10.0000 [IU] | Freq: Once | INTRAMUSCULAR | Status: AC
  Administered 2024-06-28: 10 [IU] via INTRAVENOUS
  Filled 2024-06-28: qty 10

## 2024-06-28 NOTE — ED Triage Notes (Signed)
 Pt ambulatory to triage.  Pt has n/v for 3 days.  Pt has abd pain  no diarrhea. Pt alert.

## 2024-06-28 NOTE — ED Provider Notes (Signed)
 "  [facility] Provider Note    Event Date/Time   First MD Initiated Contact with Patient 06/28/24 [phone]     (approximate)   History   Emesis   HPI  Carolyn Andrews is a 34 y.o. female with a history of diabetes, cyclical vomiting suspicious for gastroparesis who presents with nausea and vomiting.  She has not been able to keep anything down for several days.  No abdominal pain     Physical Exam   Triage Vital Signs: ED Triage Vitals [06/28/24 1725]  Encounter Vitals Group     BP 130/76     Girls Systolic BP Percentile      Girls Diastolic BP Percentile      Boys Systolic BP Percentile      Boys Diastolic BP Percentile      Pulse Rate 82     Resp 18     Temp 98 F (36.7 C)     Temp Source Oral     SpO2 99 %     Weight 83.9 kg (185 lb)     Height 1.524 m (5')     Head Circumference      Peak Flow      Pain Score 8     Pain Loc      Pain Education      Exclude from Growth Chart     Most recent vital signs: Vitals:   06/28/24 1725 06/28/24 1930  BP: 130/76 (!) 171/74  Pulse: 82 77  Resp: 18   Temp: 98 F (36.7 C)   SpO2: 99% 100%     General: Awake,  CV:  Good peripheral perfusion.  Resp:  Normal effort.  Abd:  No distention.  Soft, nontender, reassuring exam Other:     ED Results / Procedures / Treatments   Labs (all labs ordered are listed, but only abnormal results are displayed) Labs Reviewed  COMPREHENSIVE METABOLIC PANEL WITH GFR - Abnormal; Notable for the following components:      Result Value   Sodium 128 (*)    Chloride 90 (*)    Glucose, Bld 636 (*)    BUN 22 (*)    Creatinine, Ser 1.28 (*)    Total Protein 6.4 (*)    Albumin 3.0 (*)    AST 12 (*)    GFR, Estimated 56 (*)    All other components within normal limits  URINALYSIS, ROUTINE W REFLEX MICROSCOPIC - Abnormal; Notable for the following components:   Color, Urine STRAW (*)    APPearance CLEAR (*)    Glucose, UA >=500 (*)    Protein, ur  >=300 (*)    Bacteria, UA RARE (*)    All other components within normal limits  CBG MONITORING, ED - Abnormal; Notable for the following components:   Glucose-Capillary 325 (*)    All other components within normal limits  LIPASE, BLOOD  CBC  POC URINE PREG, ED     EKG     RADIOLOGY     PROCEDURES:  Critical Care performed:   Procedures   MEDICATIONS ORDERED IN ED: Medications  0.9 %  sodium chloride  infusion (0 mLs Intravenous Stopped 06/28/24 2111)  ondansetron  (ZOFRAN ) injection 4 mg (4 mg Intravenous Given 06/28/24 1926)  insulin  aspart (novoLOG ) injection 10 Units (10 Units Intravenous Given 06/28/24 1924)     IMPRESSION / MDM / ASSESSMENT AND PLAN / ED COURSE  I reviewed the triage vital signs and the nursing notes. Patient's presentation  is most consistent with severe exacerbation of chronic illness.  Patient presents with nausea vomiting in the setting of known cyclical vomiting syndrome.  Her abdominal exam is reassuring, this appears to be consistent with likely gastroparesis  Notably her glucose is 636 however CO2 is reassuring, not consistent with DKA.  Will give IV insulin , IV fluids, IV Zofran   ----------------------------------------- 11:08 PM on 06/28/2024 ----------------------------------------- Patient felt much better after treatment, glucose improved dramatically, no indication for admission at this time, appropriate for discharge, referral to gastroenterology placed.        FINAL CLINICAL IMPRESSION(S) / ED DIAGNOSES   Final diagnoses:  Gastroparesis     Rx / DC Orders   ED Discharge Orders          Ordered    Ambulatory referral to Gastroenterology        06/28/24 2202             Note:  This document was prepared using Dragon voice recognition software and may include unintentional dictation errors.   Arlander Charleston, MD 06/28/24 2308  "

## 2024-06-28 NOTE — ED Notes (Addendum)
 MD Pad informed of glucose 636, acuity changed
